# Patient Record
Sex: Male | Born: 1945 | Race: White | Hispanic: No | Marital: Married | Smoking: Never smoker
Health system: Southern US, Community
[De-identification: ages and names within clinical notes are randomized; demographics above are authoritative.]

## PROBLEM LIST (undated history)

## (undated) DIAGNOSIS — I10 Essential (primary) hypertension: Secondary | ICD-10-CM

## (undated) DIAGNOSIS — M545 Low back pain, unspecified: Secondary | ICD-10-CM

## (undated) DIAGNOSIS — C801 Malignant (primary) neoplasm, unspecified: Secondary | ICD-10-CM

## (undated) HISTORY — PX: PORT A CATH INJECTION (ARMC HX): HXRAD1731

## (undated) HISTORY — PX: OTHER SURGICAL HISTORY: SHX169

## (undated) HISTORY — PX: TONSILLECTOMY: SUR1361

---

## 2011-08-30 ENCOUNTER — Ambulatory Visit: Payer: Self-pay

## 2011-08-30 LAB — URINALYSIS, COMPLETE
Bilirubin,UR: NEGATIVE
Glucose,UR: NEGATIVE mg/dL (ref 0–75)
Ketone: NEGATIVE
Nitrite: POSITIVE
Ph: 6.5 (ref 4.5–8.0)
Protein: 300

## 2011-09-01 LAB — URINE CULTURE

## 2013-01-14 DIAGNOSIS — N400 Enlarged prostate without lower urinary tract symptoms: Secondary | ICD-10-CM | POA: Insufficient documentation

## 2013-08-15 ENCOUNTER — Emergency Department: Payer: Self-pay | Admitting: Emergency Medicine

## 2013-08-15 LAB — CBC WITH DIFFERENTIAL/PLATELET
BASOS ABS: 0.1 10*3/uL (ref 0.0–0.1)
BASOS PCT: 0.9 %
EOS PCT: 0.6 %
Eosinophil #: 0.1 10*3/uL (ref 0.0–0.7)
HCT: 46 % (ref 40.0–52.0)
HGB: 15.3 g/dL (ref 13.0–18.0)
LYMPHS PCT: 22.9 %
Lymphocyte #: 2.2 10*3/uL (ref 1.0–3.6)
MCH: 31.9 pg (ref 26.0–34.0)
MCHC: 33.2 g/dL (ref 32.0–36.0)
MCV: 96 fL (ref 80–100)
MONO ABS: 1.2 x10 3/mm — AB (ref 0.2–1.0)
Monocyte %: 12.4 %
NEUTROS PCT: 63.2 %
Neutrophil #: 6.2 10*3/uL (ref 1.4–6.5)
Platelet: 251 10*3/uL (ref 150–440)
RBC: 4.78 10*6/uL (ref 4.40–5.90)
RDW: 14.1 % (ref 11.5–14.5)
WBC: 9.8 10*3/uL (ref 3.8–10.6)

## 2013-08-15 LAB — COMPREHENSIVE METABOLIC PANEL
ALK PHOS: 64 U/L
ANION GAP: 4 — AB (ref 7–16)
Albumin: 3.5 g/dL (ref 3.4–5.0)
BILIRUBIN TOTAL: 0.3 mg/dL (ref 0.2–1.0)
BUN: 14 mg/dL (ref 7–18)
CREATININE: 1.47 mg/dL — AB (ref 0.60–1.30)
Calcium, Total: 9 mg/dL (ref 8.5–10.1)
Chloride: 106 mmol/L (ref 98–107)
Co2: 25 mmol/L (ref 21–32)
EGFR (African American): 56 — ABNORMAL LOW
EGFR (Non-African Amer.): 48 — ABNORMAL LOW
GLUCOSE: 124 mg/dL — AB (ref 65–99)
Osmolality: 272 (ref 275–301)
Potassium: 3.5 mmol/L (ref 3.5–5.1)
SGOT(AST): 38 U/L — ABNORMAL HIGH (ref 15–37)
SGPT (ALT): 46 U/L (ref 12–78)
SODIUM: 135 mmol/L — AB (ref 136–145)
Total Protein: 7.4 g/dL (ref 6.4–8.2)

## 2013-08-15 LAB — URINALYSIS, COMPLETE
Bilirubin,UR: NEGATIVE
Glucose,UR: NEGATIVE mg/dL (ref 0–75)
Ketone: NEGATIVE
Nitrite: NEGATIVE
Ph: 5 (ref 4.5–8.0)
Protein: 30
RBC,UR: 4 /HPF (ref 0–5)
SQUAMOUS EPITHELIAL: NONE SEEN
Specific Gravity: 1.023 (ref 1.003–1.030)

## 2013-08-15 LAB — CK TOTAL AND CKMB (NOT AT ARMC)
CK, TOTAL: 113 U/L (ref 35–232)
CK-MB: 2.1 ng/mL (ref 0.5–3.6)

## 2013-08-15 LAB — TROPONIN I: Troponin-I: <0.02 ng/mL

## 2014-11-11 NOTE — Consult Note (Signed)
PATIENT NAME:  Jon Gordon, Jon Gordon MR#:  [id number] DATE OF BIRTH:  08-Jan-1946  DATE OF CONSULTATION:  08/15/2013  CONSULTING PHYSICIAN:  Tana Conch. Leslye Peer, MD  PRIMARY CARE PHYSICIAN:  Dr. Astrid Divine  CONSULT REQUESTED BY:  Dr. Kerman Passey, Elizabeth: Near-syncope.   HISTORY OF PRESENT ILLNESS: This is a 69 year old man who has been sick, lying in bed for 2 days prior to this, with cough and congestion, not feeling well. He was over at a cafe today and he felt lightheaded and felt faint while he was sitting at the counter. When EMS arrived, he felt better. In the ER, he feels even better. Looking back, he was not eating and drinking very well for the past 2 days. Did not eat breakfast today, and was about to eat something for lunch. No complaints of chest pain. No shortness of breath. Some cough, grayish phlegm. In the ER, no  chest x-ray was done.   PAST MEDICAL HISTORY:  Waldenstrom's.   PAST SURGICAL HISTORY: Spleen removal, port placement, left testicle removal.   ALLERGIES: No known drug allergies.   MEDICATIONS: As per prescription writer include magnesium 400 mg daily, vitamin D 1000 international units daily.   SOCIAL HISTORY: Quit smoking in 1986. No alcohol. No drug use. Used to work as a Clinical cytogeneticist.   FAMILY HISTORY: Father died of heart disease, mother died of an MI.  REVIEW OF SYSTEMS:   CONSTITUTIONAL: Slight sweating. No fever. No chills. Positive for fatigue.  EYES: No blurry vision. Does wear contacts. EARS, NOSE, MOUTH, THROAT: Decreased hearing. Positive for sinus congestion. No difficulty swallowing. Positive for sore throat.  CARDIOVASCULAR: No chest pain. No palpitations.  RESPIRATORY: Positive for cough, gray phlegm.  GASTROINTESTINAL: No nausea. No vomiting. No abdominal pain. No diarrhea. Positive for constipation. History of hemorrhoid bleeding.  GENITOURINARY: No burning on urination or hematuria.  MUSCULOSKELETAL:  Positive for joint pain.  INTEGUMENT: No rashes or eruptions.  NEUROLOGIC: No fainting or blackouts.  PSYCHIATRIC: No anxiety or depression.  ENDOCRINE: No thyroid problems.  HEMATOLOGIC AND LYMPHATIC: No anemia, no easy bruising or bleeding.   PHYSICAL EXAMINATION: VITAL SIGNS: Temperature 98.3, pulse 69, respirations 18, blood pressure 120/60, pulse ox 96% on room air.  GENERAL: No respiratory distress.  EYES: Conjunctivae and lids normal. Pupils equal, round, and reactive to light. Extraocular muscles intact. No nystagmus.  EARS, NOSE, MOUTH AND THROAT: Tympanic membranes: Slight bulging, slight erythema. Throat: Slight erythema, no exudate seen. Lips and gums: No lesions.  NECK: No JVD. No bruits. No lymphadenopathy. No thyromegaly. No thyroid nodules palpated.  RESPIRATORY: Lungs clear to auscultation. No use of accessory muscles to breathe. No rhonchi, rales, or wheeze heard.  CARDIOVASCULAR SYSTEM: S1, S2 normal. No gallops, rubs, or murmurs heard. Carotid upstroke 2+ bilaterally. No bruits. Dorsalis pedis pulses 2+ bilaterally. Trace edema of the lower extremity.  ABDOMEN: Soft, nontender. No organomegaly/splenomegaly. Normoactive bowel sounds. No masses felt.  LYMPHATIC: No lymph nodes in the neck.  MUSCULOSKELETAL: No clubbing. Trace edema. No cyanosis.  SKIN: No rashes or ulcers seen.  NEUROLOGIC: Cranial nerves II through XII grossly intact. Deep tendon reflexes 2+ bilateral lower extremities.  PSYCHIATRIC: The patient is oriented to person, place, and time.   LABORATORY AND RADIOLOGICAL DATA: White blood cell count 9.8, H and H 15.3 and 46.0, platelet count of 251. Troponin negative. Glucose 124, BUN 14, creatinine 1.47, sodium 135, potassium 3.5, chloride 106, CO2 of 25, calcium 9.0. Liver function tests: AST slightly  elevated at 38. D-dimer negative. Urinalysis positive for leukocyte esterase.   ASSESSMENT AND PLAN:  1.  Near syncope. The patient has not been feeling well for  the past 2 days, not eating and drinking very much. The patient was hydrated in the ER and feels much better, and thinks he can go home. I will give the patient an antibiotic for an upper respiratory tract infection, also positive urinalysis. Unfortunately, no chest x-ray was done in the ER. Urine culture was not ordered, either. Will treat empirically with Augmentin for 10 days to treat upper respiratory tract infection. That should also treat urinary tract infection.   2.  Advise the patient to stay hydrated. The patient does have a history of Waldenstrom's. The patient will follow up with Dr. Astrid Divine as outpatient in follow up appointment.   Time spent on consultation: 50 minutes.    ____________________________ Tana Conch. Leslye Peer, MD rjw:mr D: 08/15/2013 18:44:00 ET T: 08/15/2013 20:01:15 ET JOB#: [id number]  cc: Floria Raveling. Astrid Divine, MD [place] Leslye Peer, MD, <Dictator>   Marisue Brooklyn MD ELECTRONICALLY SIGNED 08/19/2013 15:22

## 2014-11-23 ENCOUNTER — Other Ambulatory Visit: Payer: Self-pay | Admitting: Family Medicine

## 2014-11-23 DIAGNOSIS — G43709 Chronic migraine without aura, not intractable, without status migrainosus: Secondary | ICD-10-CM

## 2014-12-01 ENCOUNTER — Ambulatory Visit
Admission: RE | Admit: 2014-12-01 | Discharge: 2014-12-01 | Disposition: A | Discharge status: Home / Self Care | Payer: Medicare Other | Source: Ambulatory Visit | Attending: Family Medicine | Admitting: Family Medicine

## 2014-12-01 DIAGNOSIS — G43009 Migraine without aura, not intractable, without status migrainosus: Secondary | ICD-10-CM | POA: Diagnosis not present

## 2014-12-01 DIAGNOSIS — G43709 Chronic migraine without aura, not intractable, without status migrainosus: Secondary | ICD-10-CM

## 2014-12-01 MED ORDER — GADOBENATE DIMEGLUMINE 529 MG/ML IV SOLN
20.0000 mL | Freq: Once | INTRAVENOUS | Status: AC | PRN
  Administered 2014-12-01: 20 mL via INTRAVENOUS

## 2015-09-14 ENCOUNTER — Ambulatory Visit
Admission: EM | Admit: 2015-09-14 | Discharge: 2015-09-14 | Disposition: A | Discharge status: Home / Self Care | Payer: Medicare Other | Attending: Family Medicine | Admitting: Family Medicine

## 2015-09-14 DIAGNOSIS — S39012A Strain of muscle, fascia and tendon of lower back, initial encounter: Secondary | ICD-10-CM | POA: Diagnosis not present

## 2015-09-14 HISTORY — DX: Malignant (primary) neoplasm, unspecified: C80.1

## 2015-09-14 HISTORY — DX: Low back pain: M54.5

## 2015-09-14 HISTORY — DX: Low back pain, unspecified: M54.50

## 2015-09-14 MED ORDER — CYCLOBENZAPRINE HCL 10 MG PO TABS: 10.0000 mg | ORAL_TABLET | Freq: Every day | ORAL | Status: DC

## 2015-09-14 MED ORDER — KETOROLAC TROMETHAMINE 60 MG/2ML IM SOLN
60.0000 mg | Freq: Once | INTRAMUSCULAR | Status: AC
  Administered 2015-09-14: 60 mg via INTRAMUSCULAR

## 2015-09-14 MED ORDER — HYDROCODONE-ACETAMINOPHEN 5-325 MG PO TABS: 1.0000 | ORAL_TABLET | Freq: Four times a day (QID) | ORAL | Status: DC | PRN

## 2015-09-14 NOTE — ED Provider Notes (Signed)
CSN: [id number]     Arrival date & time 09/14/15  1814 History   None    Chief Complaint  Patient presents with  . Back Pain    Low Back Pain   (Consider location/radiation/quality/duration/timing/severity/associated sxs/prior Treatment) Patient is a 70 y.o. male presenting with back pain. The history is provided by the patient.  Back Pain Location:  Lumbar spine Quality:  Aching Radiates to:  Does not radiate Pain severity:  Severe Pain is:  Same all the time Duration:  6 hours Timing:  Constant Progression:  Unchanged Chronicity:  Recurrent Context: twisting   Context: not emotional stress, not falling, not jumping from heights, not lifting heavy objects, not MCA, not MVA, not occupational injury, not pedestrian accident, not physical stress, not recent illness and not recent injury   Relieved by:  None tried Worsened by:  Ambulation, twisting and standing Ineffective treatments:  None tried Associated symptoms: no abdominal pain, no abdominal swelling, no bladder incontinence, no bowel incontinence, no chest pain, no dysuria, no fever, no headaches, no leg pain, no numbness, no paresthesias, no pelvic pain, no perianal numbness, no tingling, no weakness and no weight loss     Past Medical History  Diagnosis Date  . Low back pain   . Cancer [facility])    Past Surgical History  Procedure Laterality Date  . Removal spleen    . Tonsillectomy    . Right knee arthorscopy    . Port a cath injection ([facility] hx)     History reviewed. No pertinent family history. Social History  Substance Use Topics  . Smoking status: Never Smoker   . Smokeless tobacco: Never Used  . Alcohol Use: No    Review of Systems  Constitutional: Negative for fever and weight loss.  Cardiovascular: Negative for chest pain.  Gastrointestinal: Negative for abdominal pain and bowel incontinence.  Genitourinary: Negative for bladder incontinence, dysuria and pelvic pain.  Musculoskeletal: Positive for back  pain.  Neurological: Negative for tingling, weakness, numbness, headaches and paresthesias.    Allergies  Nexium  Home Medications   Prior to Admission medications   Medication Sig Start Date End Date Taking? Authorizing Provider  cyclobenzaprine (FLEXERIL) 10 MG tablet Take 1 tablet (10 mg total) by mouth at bedtime. 09/14/15   Norval Gable, MD  HYDROcodone-acetaminophen (NORCO/VICODIN) 5-325 MG tablet Take 1-2 tablets by mouth every 6 (six) hours as needed. 09/14/15   Norval Gable, MD   Meds Ordered and Administered this Visit   Medications  ketorolac (TORADOL) injection 60 mg (60 mg Intramuscular Given 09/14/15 1953)    BP 145/68 mmHg  Pulse 60  Temp(Src) 98 F (36.7 C) (Oral)  Resp 16  Ht 5\' 10"  (1.778 m)  Wt 240 lb (108.863 kg)  BMI 34.44 kg/m2  SpO2 97% No data found.   Physical Exam  Constitutional: He appears well-developed and well-nourished. No distress.  Neck: Normal range of motion. Neck supple. No tracheal deviation present.  Pulmonary/Chest: Effort normal. No stridor. No respiratory distress.  Musculoskeletal:       Cervical back: Normal. He exhibits normal range of motion, no tenderness, no bony tenderness, no swelling, no edema, no deformity, no laceration, no pain, no spasm and normal pulse.       Lumbar back: He exhibits tenderness (over the lumbar paraspinous muscles bilaterally) and spasm. He exhibits normal range of motion, no bony tenderness, no swelling, no edema, no deformity, no laceration, no pain and normal pulse.  Neurological: He is alert. He has  normal reflexes. He exhibits normal muscle tone. Coordination normal.  Skin: No rash noted. He is not diaphoretic.  Nursing note and vitals reviewed.   ED Course  Procedures (including critical care time)  Labs Review Labs Reviewed - No data to display  Imaging Review No results found.   Visual Acuity Review  Right Eye Distance:   Left Eye Distance:   Bilateral Distance:    Right Eye  Near:   Left Eye Near:    Bilateral Near:         MDM   1. Lumbar strain, initial encounter    New Prescriptions   CYCLOBENZAPRINE (FLEXERIL) 10 MG TABLET    Take 1 tablet (10 mg total) by mouth at bedtime.   HYDROCODONE-ACETAMINOPHEN (NORCO/VICODIN) 5-325 MG TABLET    Take 1-2 tablets by mouth every 6 (six) hours as needed.   1. diagnosis reviewed with patient 2. rx as per orders above; reviewed possible side effects, interactions, risks and benefits  3. Recommend supportive treatment with heat to affected area; gentle stretching 4. Follow-up prn if symptoms worsen or don't improve    Norval Gable, MD 09/14/15 2005

## 2015-09-14 NOTE — ED Notes (Addendum)
Patient c/o low back pain which started earlier today after making a quick turn around the corner in his house.  Pain while walking and standing but not while in a seated position.  He has a history of low back pain however.  Lastly, the patient took 1 tab Norco 5-325mg  at 3:00pm or 4:00pm.

## 2015-12-18 ENCOUNTER — Ambulatory Visit
Admission: EM | Admit: 2015-12-18 | Discharge: 2015-12-18 | Disposition: A | Discharge status: Home / Self Care | Payer: Medicare PPO | Attending: Family Medicine | Admitting: Family Medicine

## 2015-12-18 ENCOUNTER — Ambulatory Visit
Admission: RE | Admit: 2015-12-18 | Discharge: 2015-12-18 | Disposition: A | Discharge status: Home / Self Care | Payer: Medicare PPO | Source: Ambulatory Visit | Attending: Family Medicine | Admitting: Family Medicine

## 2015-12-18 ENCOUNTER — Encounter: Payer: Self-pay | Admitting: *Deleted

## 2015-12-18 ENCOUNTER — Ambulatory Visit
Admission: EM | Admit: 2015-12-18 | Discharge: 2015-12-18 | Disposition: A | Discharge status: Home / Self Care | Payer: Medicare PPO | Source: Ambulatory Visit | Attending: Family Medicine | Admitting: Family Medicine

## 2015-12-18 DIAGNOSIS — M79604 Pain in right leg: Secondary | ICD-10-CM | POA: Diagnosis not present

## 2015-12-18 DIAGNOSIS — M7989 Other specified soft tissue disorders: Secondary | ICD-10-CM | POA: Insufficient documentation

## 2015-12-18 DIAGNOSIS — M79661 Pain in right lower leg: Secondary | ICD-10-CM

## 2015-12-18 NOTE — Discharge Instructions (Signed)
Musculoskeletal Pain Musculoskeletal pain is muscle and boney aches and pains. These pains can occur in any part of the body. Your caregiver may treat you without knowing the cause of the pain. They may treat you if blood or urine tests, X-rays, and other tests were normal.  CAUSES There is often not a definite cause or reason for these pains. These pains may be caused by a type of germ (virus). The discomfort may also come from overuse. Overuse includes working out too hard when your body is not fit. Boney aches also come from weather changes. Bone is sensitive to atmospheric pressure changes. HOME CARE INSTRUCTIONS   Ask when your test results will be ready. Make sure you get your test results.  Only take over-the-counter or prescription medicines for pain, discomfort, or fever as directed by your caregiver. If you were given medications for your condition, do not drive, operate machinery or power tools, or sign legal documents for 24 hours. Do not drink alcohol. Do not take sleeping pills or other medications that may interfere with treatment.  Continue all activities unless the activities cause more pain. When the pain lessens, slowly resume normal activities. Gradually increase the intensity and duration of the activities or exercise.  During periods of severe pain, bed rest may be helpful. Lay or sit in any position that is comfortable.  Putting ice on the injured area.  Put ice in a bag.  Place a towel between your skin and the bag.  Leave the ice on for 15 to 20 minutes, 3 to 4 times a day.  Follow up with your caregiver for continued problems and no reason can be found for the pain. If the pain becomes worse or does not go away, it may be necessary to repeat tests or do additional testing. Your caregiver may need to look further for a possible cause. SEEK IMMEDIATE MEDICAL CARE IF:  You have pain that is getting worse and is not relieved by medications.  You develop chest pain  that is associated with shortness or breath, sweating, feeling sick to your stomach (nauseous), or throw up (vomit).  Your pain becomes localized to the abdomen.  You develop any new symptoms that seem different or that concern you. MAKE SURE YOU:   Understand these instructions.  Will watch your condition.  Will get help right away if you are not doing well or get worse.   This information is not intended to replace advice given to you by your health care provider. Make sure you discuss any questions you have with your health care provider.   Document Released: 07/07/2005 Document Revised: 09/29/2011 Document Reviewed: 03/11/2013 Elsevier Interactive Patient Education 2016 Elsevier [employer]  Edema Edema is an abnormal buildup of fluids. It is more common in your legs and thighs. Painless swelling of the feet and ankles is more likely as a person ages. It also is common in looser skin, like around your eyes. HOME CARE   Keep the affected body part above the level of the heart while lying down.  Do not sit still or stand for a long time.  Do not put anything right under your knees when you lie down.  Do not wear tight clothes on your upper legs.  Exercise your legs to help the puffiness (swelling) go down.  Wear elastic bandages or support stockings as told by your doctor.  A low-salt diet may help lessen the puffiness.  Only take medicine as told by your doctor. GET HELP  IF:  Treatment is not working.  You have heart, liver, or kidney disease and notice that your skin looks puffy or shiny.  You have puffiness in your legs that does not get better when you raise your legs.  You have sudden weight gain for no reason. GET HELP RIGHT AWAY IF:   You have shortness of breath or chest pain.  You cannot breathe when you lie down.  You have pain, redness, or warmth in the areas that are puffy.  You have heart, liver, or kidney disease and get edema all of a sudden.  You  have a fever and your symptoms get worse all of a sudden. MAKE SURE YOU:   Understand these instructions.  Will watch your condition.  Will get help right away if you are not doing well or get worse.   This information is not intended to replace advice given to you by your health care provider. Make sure you discuss any questions you have with your health care provider.   Document Released: 12/24/2007 Document Revised: 07/12/2013 Document Reviewed: 04/29/2013 Elsevier Interactive Patient Education [employer].

## 2015-12-18 NOTE — ED Notes (Signed)
Onset of right calf pain, onset 3-4 hours ago. Pain increases with full leg extension and is tender to palpation on the medial aspect of right calf.

## 2015-12-18 NOTE — ED Provider Notes (Signed)
CSN: [id number]     Arrival date & time 12/18/15  1645 History   First MD Initiated Contact with Patient 12/18/15 1817    Nurses notes were reviewed. Chief Complaint  Patient presents with  . Leg Pain  Patient is a here because of pain in the right leg. He reports pain in the right calf started earlier today. He has also noticed some swelling in the right lower leg as well. He denies any history of trauma injury to the right leg but states that over the weekend he mowed his lawn several times and was on the tractor for several hours both Saturday and Sunday. (Consider location/radiation/quality/duration/timing/severity/associated sxs/prior Treatment) Patient is a 70 y.o. male presenting with leg pain. The history is provided by the patient. No language interpreter was used.  Leg Pain Location:  Leg Injury: no   Leg location:  R lower leg Pain details:    Quality:  Aching, pressure and throbbing   Radiates to:  Does not radiate   Severity:  Moderate   Onset quality:  Sudden   Timing:  Constant Chronicity:  New Foreign body present:  No foreign bodies Prior injury to area:  No Relieved by:  Nothing Worsened by:  Nothing tried Ineffective treatments:  None tried Associated symptoms: stiffness and swelling   Risk factors: no concern for non-accidental trauma, no frequent fractures, no known bone disorder and no recent illness     Past Medical History  Diagnosis Date  . Low back pain   . Cancer [facility])    Past Surgical History  Procedure Laterality Date  . Removal spleen    . Tonsillectomy    . Right knee arthorscopy    . Port a cath injection ([facility] hx)     History reviewed. No pertinent family history. Social History  Substance Use Topics  . Smoking status: Never Smoker   . Smokeless tobacco: Never Used  . Alcohol Use: No    Review of Systems  Musculoskeletal: Positive for myalgias and stiffness.  All other systems reviewed and are negative.   Allergies   Nexium  Home Medications   Prior to Admission medications   Medication Sig Start Date End Date Taking? Authorizing Provider  cyclobenzaprine (FLEXERIL) 10 MG tablet Take 1 tablet (10 mg total) by mouth at bedtime. 09/14/15   Norval Gable, MD  HYDROcodone-acetaminophen (NORCO/VICODIN) 5-325 MG tablet Take 1-2 tablets by mouth every 6 (six) hours as needed. 09/14/15   Norval Gable, MD   Meds Ordered and Administered this Visit  Medications - No data to display  BP 145/73 mmHg  Pulse 57  Temp(Src) 98.2 F (36.8 C) (Oral)  Resp 16  Ht 5\' 10"  (1.778 m)  Wt 240 lb (108.863 kg)  BMI 34.44 kg/m2  SpO2 98% No data found.   Physical Exam  Constitutional: He is oriented to person, place, and time. He appears well-developed and well-nourished.  HENT:  Head: Normocephalic and atraumatic.  Eyes: Pupils are equal, round, and reactive to light.  Neck: Normal range of motion.  Musculoskeletal: He exhibits edema and tenderness.  Neurological: He is alert and oriented to person, place, and time.  Skin: Skin is warm. No erythema.  Psychiatric: He has a normal mood and affect.  Vitals reviewed.   ED Course  Procedures (including critical care time)  Labs Review Labs Reviewed - No data to display  Imaging Review No results found.   Visual Acuity Review  Right Eye Distance:   Left Eye Distance:  Bilateral Distance:    Right Eye Near:   Left Eye Near:    Bilateral Near:        MDM   1. Right leg pain   2. Pain and swelling of right lower leg    Patient had several questions are as if this could be a possible blood clot he 1 m diagnosing here in the office and explained to him there is no way that I can diagnose him for blood clot without doing a study. He was worried about co-pays for symptom hospital at this point in time is no other option I did explain to it is superficial or is no blood clots he gets to go home if this of deep blood clot he will need to go to the  emergency room the hope process starts all over and his new charges.Marland Kitchen He wanted to know if I can give him something to dissolve clots right ear and does not do the study explained to him actually were not dissolving clots were just trying to prevent further clots from occurring and that if he has any shortness of breath which is what he was concerned about the knee may have infiltrated but they don't do that initially.  He'll go to [facility] now to have the ultrasound done since it is after 6:30 PM.after 6:30 PM.   Note: This dictation was prepared with Dragon dictation along with smaller phrase technology. Any transcriptional errors that result from this process are unintentional.   Ultrasound report verbal was negative patient was informed and informed not to do anything different but just go home and rest the leg pop his PCP if needed.Frederich Cha, MD 12/18/15 2039

## 2017-07-19 ENCOUNTER — Other Ambulatory Visit: Payer: Self-pay

## 2017-07-19 ENCOUNTER — Encounter: Payer: Self-pay | Admitting: *Deleted

## 2017-07-19 ENCOUNTER — Ambulatory Visit
Admission: EM | Admit: 2017-07-19 | Discharge: 2017-07-19 | Disposition: A | Discharge status: Home / Self Care | Payer: Medicare HMO | Attending: Family Medicine | Admitting: Family Medicine

## 2017-07-19 DIAGNOSIS — M545 Low back pain, unspecified: Secondary | ICD-10-CM

## 2017-07-19 MED ORDER — KETOROLAC TROMETHAMINE 60 MG/2ML IM SOLN
60.0000 mg | Freq: Once | INTRAMUSCULAR | Status: AC
  Administered 2017-07-19: 60 mg via INTRAMUSCULAR

## 2017-07-19 MED ORDER — CYCLOBENZAPRINE HCL 10 MG PO TABS: 10.0000 mg | ORAL_TABLET | Freq: Every day | ORAL | 0 refills | Status: DC

## 2017-07-19 MED ORDER — HYDROCODONE-ACETAMINOPHEN 5-325 MG PO TABS: ORAL_TABLET | ORAL | 0 refills | Status: DC

## 2017-07-19 NOTE — ED Triage Notes (Signed)
Patient started having lower back pain 1.5 weeks ago and has been treated by a chiropractor. Back pain has not resolved.

## 2017-07-19 NOTE — ED Provider Notes (Signed)
[facility]    CSN: [id number] Arrival date & time: 07/19/17  1440     History   Chief Complaint Chief Complaint  Patient presents with  . Back Pain    HPI Jon Gordon is a 71 y.o. male.   71 yo male with a h/o chronic, intermittent back pain presents with a c/o flare up of his back condition for the past 1.5 weeks. Has seen his chiropractor which has helped some, however patient still having pain. Denies numbness/tingling, pain radiating, bowel or bladder problems. Patient was seen here last year for same and states toradol and muscle relaxer helped.    The history is provided by the patient.  Back Pain    Past Medical History:  Diagnosis Date  . Cancer ([place])   . Low back pain     There are no active problems to display for this patient.   Past Surgical History:  Procedure Laterality Date  . PORT A CATH INJECTION ([facility])    . Removal Spleen    . Right Knee Arthorscopy    . TONSILLECTOMY         Home Medications    Prior to Admission medications   Medication Sig Start Date End Date Taking? Authorizing Provider  cyclobenzaprine (FLEXERIL) 10 MG tablet Take 1 tablet (10 mg total) by mouth at bedtime. 07/19/17   Norval Gable, MD  HYDROcodone-acetaminophen (NORCO/VICODIN) 5-325 MG tablet 1-2 tabs po bid prn 07/19/17   Norval Gable, MD    Family History History reviewed. No pertinent family history.  Social History Social History   Tobacco Use  . Smoking status: Never Smoker  . Smokeless tobacco: Never Used  Substance Use Topics  . Alcohol use: No  . Drug use: No     Allergies   Nexium [esomeprazole]   Review of Systems Review of Systems  Musculoskeletal: Positive for back pain.     Physical Exam Triage Vital Signs ED Triage Vitals  Enc Vitals Group     BP 07/19/17 1518 (!) 137/58     Pulse Rate 07/19/17 1518 69     Resp 07/19/17 1518 16     Temp 07/19/17 1518 98.7 F (37.1 C)     Temp Source 07/19/17 1518 Oral      SpO2 07/19/17 1518 99 %     Weight 07/19/17 1519 243 lb (110.2 kg)     Height 07/19/17 1519 5\' 10"  (1.778 m)     Head Circumference --      Peak Flow --      Pain Score 07/19/17 1519 0     Pain Loc --      Pain Edu? --      Excl. in [place]? --    No data found.  Updated Vital Signs BP (!) 137/58 (BP Location: Left Arm)   Pulse 69   Temp 98.7 F (37.1 C) (Oral)   Resp 16   Ht 5\' 10"  (1.778 m)   Wt 243 lb (110.2 kg)   SpO2 99%   BMI 34.87 kg/m   Visual Acuity Right Eye Distance:   Left Eye Distance:   Bilateral Distance:    Right Eye Near:   Left Eye Near:    Bilateral Near:     Physical Exam  Constitutional: He appears well-developed and well-nourished. No distress.  Neck: Normal range of motion. Neck supple. No tracheal deviation present.  Pulmonary/Chest: Effort normal. No stridor. No respiratory distress.  Musculoskeletal:  Cervical back: He exhibits normal range of motion, no tenderness, no bony tenderness, no swelling, no edema, no deformity, no laceration, no pain, no spasm and normal pulse.       Lumbar back: He exhibits tenderness and spasm. He exhibits normal range of motion, no bony tenderness, no swelling, no edema, no deformity, no laceration, no pain and normal pulse.  Neurological: He is alert. He has normal reflexes. He displays normal reflexes. He exhibits normal muscle tone. Coordination normal.  Skin: No rash noted. He is not diaphoretic.  Nursing note and vitals reviewed.    UC Treatments / Results  Labs (all labs ordered are listed, but only abnormal results are displayed) Labs Reviewed - No data to display  EKG  EKG Interpretation None       Radiology No results found.  Procedures Procedures (including critical care time)  Medications Ordered in UC Medications  ketorolac (TORADOL) injection 60 mg (60 mg Intramuscular Given 07/19/17 1554)     Initial Impression / Assessment and Plan / UC Course  I have reviewed the triage  vital signs and the nursing notes.  Pertinent labs & imaging results that were available during my care of the patient were reviewed by me and considered in my medical decision making (see chart for details).       Final Clinical Impressions(s) / UC Diagnoses   Final diagnoses:  Acute bilateral low back pain without sciatica    ED Discharge Orders        Ordered    cyclobenzaprine (FLEXERIL) 10 MG tablet  Daily at bedtime     07/19/17 1612    HYDROcodone-acetaminophen (NORCO/VICODIN) 5-325 MG tablet     07/19/17 1612     1. diagnosis reviewed with patient 2. Given Toradol 60mg  IM x1  3. rx as per orders above; reviewed possible side effects, interactions, risks and benefits  4. Recommend supportive treatment with stretches, heat/ice, otc analgesics prn  5. Follow-up prn if symptoms worsen or don't improve  Controlled Substance Prescriptions  Controlled Substance Registry consulted? Not Applicable   Norval Gable, MD 07/19/17 [phone]

## 2017-08-23 ENCOUNTER — Ambulatory Visit
Admission: EM | Admit: 2017-08-23 | Discharge: 2017-08-23 | Disposition: A | Discharge status: Home / Self Care | Payer: Medicare HMO | Attending: Emergency Medicine | Admitting: Emergency Medicine

## 2017-08-23 ENCOUNTER — Encounter: Payer: Self-pay | Admitting: Emergency Medicine

## 2017-08-23 ENCOUNTER — Other Ambulatory Visit: Payer: Self-pay

## 2017-08-23 DIAGNOSIS — L03031 Cellulitis of right toe: Secondary | ICD-10-CM

## 2017-08-23 DIAGNOSIS — L02611 Cutaneous abscess of right foot: Secondary | ICD-10-CM | POA: Diagnosis not present

## 2017-08-23 DIAGNOSIS — L0291 Cutaneous abscess, unspecified: Secondary | ICD-10-CM

## 2017-08-23 MED ORDER — CEPHALEXIN 500 MG PO CAPS: 500.0000 mg | ORAL_CAPSULE | Freq: Four times a day (QID) | ORAL | 0 refills | Status: DC

## 2017-08-23 NOTE — ED Triage Notes (Signed)
Patient c/o bump on his left 3rd toe on his left foot for months.

## 2017-08-23 NOTE — ED Provider Notes (Signed)
[facility]    CSN: [id number] Arrival date & time: 08/23/17  0905     History   Chief Complaint Chief Complaint  Patient presents with  . Abscess    HPI Jon Gordon is a 72 y.o. male.   Pt states that he has noticed a bump to his RT 2nd toe for over approx 1 yr now.last night his wife "popped" it and it began red and hard. Denies any pain. Denies any rubbing of the shoe to area. Has not taken anything for this.       Past Medical History:  Diagnosis Date  . Cancer ([place])   . Low back pain     There are no active problems to display for this patient.   Past Surgical History:  Procedure Laterality Date  . PORT A CATH INJECTION ([place] [facility])    . Removal Spleen    . Right Knee Arthorscopy    . TONSILLECTOMY         Home Medications    Prior to Admission medications   Medication Sig Start Date End Date Taking? Authorizing Provider  cyclobenzaprine (FLEXERIL) 10 MG tablet Take 1 tablet (10 mg total) by mouth at bedtime. 07/19/17   Norval Gable, MD  HYDROcodone-acetaminophen (NORCO/VICODIN) 5-325 MG tablet 1-2 tabs po bid prn 07/19/17   Norval Gable, MD    Family History History reviewed. No pertinent family history.  Social History Social History   Tobacco Use  . Smoking status: Never Smoker  . Smokeless tobacco: Never Used  Substance Use Topics  . Alcohol use: No  . Drug use: No     Allergies   Nexium [esomeprazole]   Review of Systems Review of Systems  Constitutional: Negative.   Respiratory: Negative.   Cardiovascular: Negative.   Skin: Positive for wound.       RT 2nd toe has a small pin point area to mid toes that has redness and a hard flat circle with a scabbing.   Neurological: Negative.      Physical Exam Triage Vital Signs ED Triage Vitals  Enc Vitals Group     BP 08/23/17 0925 (!) 142/60     Pulse Rate 08/23/17 0925 68     Resp 08/23/17 0925 16     Temp 08/23/17 0925 98 F (36.7 C)     Temp Source  08/23/17 0925 Oral     SpO2 08/23/17 0925 97 %     Weight 08/23/17 0922 243 lb (110.2 kg)     Height 08/23/17 0922 5\' 9"  (1.753 m)     Head Circumference --      Peak Flow --      Pain Score 08/23/17 0922 0     Pain Loc --      Pain Edu? --      Excl. in [place]? --    No data found.  Updated Vital Signs BP (!) 142/60 (BP Location: Right Arm)   Pulse 68   Temp 98 F (36.7 C) (Oral)   Resp 16   Ht 5\' 9"  (1.753 m)   Wt 243 lb (110.2 kg)   SpO2 97%   BMI 35.88 kg/m   Visual Acuity     Physical Exam  Constitutional: He appears well-developed.  Cardiovascular: Normal rate.  Pulmonary/Chest: Effort normal and breath sounds normal.  Musculoskeletal:  RT 2nd toe has a  Small hard non fluid filled area mid center of toe with a 1cm boarder of erythema to area. Non  tender.   Skin: Capillary refill takes less than 2 seconds. There is erythema.     UC Treatments / Results  Labs (all labs ordered are listed, but only abnormal results are displayed) Labs Reviewed - No data to display  EKG  EKG Interpretation None       Radiology No results found.  Procedures Procedures (including critical care time)  Medications Ordered in UC Medications - No data to display   Initial Impression / Assessment and Plan / UC Course  I have reviewed the triage vital signs and the nursing notes.  Pertinent labs & imaging results that were available during my care of the patient were reviewed by me and considered in my medical decision making (see chart for details).     Wash dry area well Apply a bandage to area to prevent rubbing  Take full dose of abx  See podiatry you may need a referral from pcp    Final Clinical Impressions(s) / UC Diagnoses   Final diagnoses:  None    ED Discharge Orders    None       Controlled Substance Prescriptions [place] Controlled Substance Registry consulted? Not Applicable   Marney Setting, NP 08/23/17 [phone]

## 2017-08-23 NOTE — Discharge Instructions (Signed)
Wash dry area well Apply a bandage to area to prevent rubbing  Take full dose of abx  See podiatry you may need a referral from pcp

## 2017-08-26 ENCOUNTER — Telehealth: Payer: Self-pay

## 2017-08-26 NOTE — Telephone Encounter (Signed)
Called pt for f/u and he states his wound is still sore and now has crusty outside. Has f/u appt with PCP on Monday. Instr if he needs to be seen prior to Monday, he can return here. Verbalized understanding.

## 2020-08-08 ENCOUNTER — Ambulatory Visit
Admission: EM | Admit: 2020-08-08 | Discharge: 2020-08-08 | Disposition: A | Discharge status: Home / Self Care | Payer: Medicare Other | Attending: Emergency Medicine | Admitting: Emergency Medicine

## 2020-08-08 ENCOUNTER — Encounter: Payer: Self-pay | Admitting: Emergency Medicine

## 2020-08-08 ENCOUNTER — Other Ambulatory Visit: Payer: Self-pay

## 2020-08-08 DIAGNOSIS — M545 Low back pain, unspecified: Secondary | ICD-10-CM | POA: Diagnosis not present

## 2020-08-08 HISTORY — DX: Essential (primary) hypertension: I10

## 2020-08-08 MED ORDER — KETOROLAC TROMETHAMINE 60 MG/2ML IM SOLN
60.0000 mg | Freq: Once | INTRAMUSCULAR | Status: AC
  Administered 2020-08-08: 60 mg via INTRAMUSCULAR

## 2020-08-08 MED ORDER — HYDROCODONE-ACETAMINOPHEN 5-325 MG PO TABS: 2.0000 | ORAL_TABLET | ORAL | 0 refills | Status: DC | PRN

## 2020-08-08 NOTE — ED Provider Notes (Signed)
[facility]    CSN: [id number] Arrival date & time: 08/08/20  1800      History   Chief Complaint No chief complaint on file.   HPI Jon Gordon is a 75 y.o. male.   HPI   75 year old male here for evaluation of low back pain.  Patient reports that he has a long history of low back problems.  Patient did have a fall last week where he landed on both hands and his right knee but states that he was having pain for the fall.  Patient reports his pain is worse on the right than the left.  Patient states that trying to stand up straight increases the pain and leaning over at the waist improves the pain.  Patient denies radiation into either leg, numbness, tingling, or weakness in either extremity, and denies heavy lifting.  Past Medical History:  Diagnosis Date  . Cancer ([place])   . Hypertension   . Low back pain     There are no problems to display for this patient.   Past Surgical History:  Procedure Laterality Date  . PORT A CATH INJECTION ([place] [facility]X)    . Removal Spleen    . Right Knee Arthorscopy    . TONSILLECTOMY         Home Medications    Prior to Admission medications   Medication Sig Start Date End Date Taking? Authorizing Provider  HYDROcodone-acetaminophen (NORCO/VICODIN) 5-325 MG tablet Take 2 tablets by mouth every 4 (four) hours as needed. 08/08/20  Yes Margarette Canada, NP    Family History History reviewed. No pertinent family history.  Social History Social History   Tobacco Use  . Smoking status: Never Smoker  . Smokeless tobacco: Never Used  Vaping Use  . Vaping Use: Never used  Substance Use Topics  . Alcohol use: No  . Drug use: No     Allergies   Nexium [esomeprazole]   Review of Systems Review of Systems  Constitutional: Negative for activity change.  Musculoskeletal: Positive for back pain. Negative for arthralgias and myalgias.  Skin: Negative for rash.  Neurological: Negative for numbness.  Hematological:  Negative.   Psychiatric/Behavioral: Negative.      Physical Exam Triage Vital Signs ED Triage Vitals  Enc Vitals Group     BP 08/08/20 1907 (!) 138/56     Pulse Rate 08/08/20 1907 62     Resp 08/08/20 1907 16     Temp 08/08/20 1907 98.3 F (36.8 C)     Temp Source 08/08/20 1907 Oral     SpO2 08/08/20 1907 96 %     Weight 08/08/20 1903 210 lb (95.3 kg)     Height 08/08/20 1903 5\' 10"  (1.778 m)     Head Circumference --      Peak Flow --      Pain Score 08/08/20 1902 0     Pain Loc --      Pain Edu? --      Excl. in [place]? --    No data found.  Updated Vital Signs BP (!) 138/56 (BP Location: Left Arm)   Pulse 62   Temp 98.3 F (36.8 C) (Oral)   Resp 16   Ht 5\' 10"  (1.778 m)   Wt 210 lb (95.3 kg)   SpO2 96%   BMI 30.13 kg/m   Visual Acuity Right Eye Distance:   Left Eye Distance:   Bilateral Distance:    Right Eye Near:   Left Eye  Near:    Bilateral Near:     Physical Exam Vitals and nursing note reviewed.  Constitutional:      General: He is not in acute distress.    Appearance: Normal appearance.  HENT:     Head: Normocephalic and atraumatic.  Cardiovascular:     Rate and Rhythm: Normal rate and regular rhythm.     Pulses: Normal pulses.     Heart sounds: Normal heart sounds. No murmur heard. No gallop.   Pulmonary:     Effort: Pulmonary effort is normal.     Breath sounds: Normal breath sounds. No wheezing, rhonchi or rales.  Musculoskeletal:        General: Tenderness present. No swelling or deformity. Normal range of motion.  Skin:    General: Skin is warm and dry.     Capillary Refill: Capillary refill takes less than 2 seconds.     Findings: No erythema.  Neurological:     General: No focal deficit present.     Mental Status: He is alert and oriented to person, place, and time.     Motor: No weakness.     Deep Tendon Reflexes: Reflexes normal.  Psychiatric:        Mood and Affect: Mood normal.        Behavior: Behavior normal.         Thought Content: Thought content normal.        Judgment: Judgment normal.      UC Treatments / Results  Labs (all labs ordered are listed, but only abnormal results are displayed) Labs Reviewed - No data to display  EKG   Radiology No results found.  Procedures Procedures (including critical care time)  Medications Ordered in UC Medications  ketorolac (TORADOL) injection 60 mg (has no administration in time range)    Initial Impression / Assessment and Plan / UC Course  I have reviewed the triage vital signs and the nursing notes.  Pertinent labs & imaging results that were available during my care of the patient were reviewed by me and considered in my medical decision making (see chart for details).   Is here for evaluation of low back pain that has been going on for a while.  Patient reports that this is a longstanding occurrence.  Patient has been evaluated for low back pain 3 times at this urgent care since 2017.  Each time he has been treated with an injection of Toradol, and discharged home with Vicodin for pain control.   Final Clinical Impressions(s) / UC Diagnoses   Final diagnoses:  Bilateral low back pain without sciatica, unspecified chronicity     Discharge Instructions     The injection we gave will help you with the inflammation.  Follow the low back exercises given in your discharge paperwork.  Use the Norco as needed for severe pain.  Do not take with other Tylenol containing products.  Do not take with alcohol, and do not drive a vehicle.    ED Prescriptions    Medication Sig Dispense Auth. Provider   HYDROcodone-acetaminophen (NORCO/VICODIN) 5-325 MG tablet Take 2 tablets by mouth every 4 (four) hours as needed. 10 tablet Margarette Canada, NP     I have reviewed the PDMP during this encounter.   Margarette Canada, NP 08/08/20 2012

## 2020-08-08 NOTE — Discharge Instructions (Signed)
The injection we gave will help you with the inflammation.  Follow the low back exercises given in your discharge paperwork.  Use the Norco as needed for severe pain.  Do not take with other Tylenol containing products.  Do not take with alcohol, and do not drive a vehicle.

## 2020-08-08 NOTE — ED Triage Notes (Signed)
Pt presents with lower back pain.  He fell down outside steps last week.

## 2021-02-16 ENCOUNTER — Other Ambulatory Visit: Payer: Self-pay

## 2021-02-16 ENCOUNTER — Ambulatory Visit
Admission: EM | Admit: 2021-02-16 | Discharge: 2021-02-16 | Disposition: A | Discharge status: Home / Self Care | Payer: Medicare Other | Attending: Family Medicine | Admitting: Family Medicine

## 2021-02-16 DIAGNOSIS — M545 Low back pain, unspecified: Secondary | ICD-10-CM | POA: Diagnosis not present

## 2021-02-16 MED ORDER — KETOROLAC TROMETHAMINE 60 MG/2ML IM SOLN
30.0000 mg | Freq: Once | INTRAMUSCULAR | Status: AC
  Administered 2021-02-16: 30 mg via INTRAMUSCULAR

## 2021-02-16 NOTE — ED Triage Notes (Signed)
Patient states that he is here for low back pain that started around 2-3 days ago.

## 2021-02-16 NOTE — Discharge Instructions (Addendum)
Rest.  Heat.  Gentle stretching.  Take care  Dr. Lacinda Axon

## 2021-02-17 NOTE — ED Provider Notes (Signed)
[facility]    CSN: [id number] Arrival date & time: 02/16/21  1357      History   Chief Complaint Chief Complaint  Patient presents with   Back Pain    HPI  75 year old male presents with back pain.  Low back x 2-3 days. No known inciting factor. No radicular symptoms. He reports decreased ROM. Pain 6/10 in severity. Achy in character. No saddle anesthesia or incontinence. No other complaints.    Past Medical History:  Diagnosis Date   Cancer ([place])    Hypertension    Low back pain     Past Surgical History:  Procedure Laterality Date   PORT A CATH INJECTION ([facility] HX)     Removal Spleen     Right Knee Arthorscopy     TONSILLECTOMY         Home Medications    Prior to Admission medications   Medication Sig Start Date End Date Taking? Authorizing Provider  lisinopril (ZESTRIL) 2.5 MG tablet Take 2.5 mg by mouth daily. 12/21/20   [provider]    Family History History reviewed. No pertinent family history.  Social History Social History   Tobacco Use   Smoking status: Never   Smokeless tobacco: Never  Vaping Use   Vaping Use: Never used  Substance Use Topics   Alcohol use: No   Drug use: No     Allergies   Nexium [esomeprazole]   Review of Systems Review of Systems Per HPI  Physical Exam Triage Vital Signs ED Triage Vitals  Enc Vitals Group     BP 02/16/21 1407 (!) 129/57     Pulse Rate 02/16/21 1407 62     Resp 02/16/21 1407 18     Temp 02/16/21 1407 98.8 F (37.1 C)     Temp Source 02/16/21 1407 Oral     SpO2 02/16/21 1407 96 %     Weight 02/16/21 1405 218 lb (98.9 kg)     Height 02/16/21 1405 '5\' 10"'$  (1.778 m)     Head Circumference --      Peak Flow --      Pain Score 02/16/21 1405 6     Pain Loc --      Pain Edu? --      Excl. in [place]? --    No data found.  Updated Vital Signs BP (!) 129/57 (BP Location: Right Arm)   Pulse 62   Temp 98.8 F (37.1 C) (Oral)   Resp 18   Ht '5\' 10"'$  (1.778 m)   Wt  98.9 kg   SpO2 96%   BMI 31.28 kg/m   Visual Acuity Right Eye Distance:   Left Eye Distance:   Bilateral Distance:    Right Eye Near:   Left Eye Near:    Bilateral Near:     Physical Exam Vitals and nursing note reviewed.  Constitutional:      General: He is not in acute distress.    Appearance: Normal appearance. He is not ill-appearing.  HENT:     Head: Normocephalic and atraumatic.  Cardiovascular:     Rate and Rhythm: Normal rate and regular rhythm.  Pulmonary:     Effort: Pulmonary effort is normal.     Breath sounds: Normal breath sounds.  Musculoskeletal:     Comments: Mild tenderness of the paraspinal musculature of the lumbar spine.  Neurological:     Mental Status: He is alert.  Psychiatric:  Mood and Affect: Mood normal.        Behavior: Behavior normal.     UC Treatments / Results  Labs (all labs ordered are listed, but only abnormal results are displayed) Labs Reviewed - No data to display  EKG   Radiology No results found.  Procedures Procedures (including critical care time)  Medications Ordered in UC Medications  ketorolac (TORADOL) injection 30 mg (30 mg Intramuscular Given 02/16/21 1430)    Initial Impression / Assessment and Plan / UC Course  I have reviewed the triage vital signs and the nursing notes.  Pertinent labs & imaging results that were available during my care of the patient were reviewed by me and considered in my medical decision making (see chart for details).    75 year old male presents with acute back pain. Treating with IM Toradol (per patient request). Renal function - most recent creatinine 1.1 with GFR 30. IM Toradol 30 mg given. Advised heat and supportive care.   Final Clinical Impressions(s) / UC Diagnoses   Final diagnoses:  Acute bilateral low back pain without sciatica     Discharge Instructions      Rest.  Heat.  Gentle stretching.  Take care  Dr. Lacinda Axon    ED Prescriptions   None     PDMP not reviewed this encounter.   Coral Spikes, [place] 02/17/21 2247

## 2021-02-22 ENCOUNTER — Other Ambulatory Visit: Payer: Self-pay

## 2021-02-22 ENCOUNTER — Ambulatory Visit
Admission: RE | Admit: 2021-02-22 | Discharge: 2021-02-22 | Disposition: A | Discharge status: Home / Self Care | Payer: Medicare Other | Source: Ambulatory Visit | Attending: Physician Assistant | Admitting: Physician Assistant

## 2021-02-22 VITALS — BP 137/60 | HR 62 | Temp 98.7°F | Resp 16 | Ht 70.0 in | Wt 218.0 lb

## 2021-02-22 DIAGNOSIS — M545 Low back pain, unspecified: Secondary | ICD-10-CM

## 2021-02-22 MED ORDER — KETOROLAC TROMETHAMINE 60 MG/2ML IM SOLN
30.0000 mg | Freq: Once | INTRAMUSCULAR | Status: AC
Start: 2021-02-22 — End: 2021-02-22
  Administered 2021-02-22: 30 mg via INTRAMUSCULAR

## 2021-02-22 MED ORDER — BACLOFEN 10 MG PO TABS: 10.0000 mg | ORAL_TABLET | Freq: Three times a day (TID) | ORAL | 0 refills | Status: DC | PRN

## 2021-02-22 NOTE — ED Triage Notes (Signed)
Patient c/o ongoing lower back pain that started a week ago.  Patient states that he might would like a muscle relaxer Patient denies recent injury or fall.

## 2021-02-22 NOTE — Discharge Instructions (Addendum)
BACK PAIN: Stressed avoiding painful activities . RICE (REST, ICE, COMPRESSION, ELEVATION) guidelines reviewed. May alternate ice and heat. Consider use of muscle rubs, Salonpas patches, lidocaine patches, etc. Use medications as directed including muscle relaxers if prescribed. Take anti-inflammatory medications as prescribed or OTC NSAIDs/Tylenol.  F/u with PCP in 7-10 days for reexamination, and please feel free to call or return to the urgent care at any time for any questions or concerns you may have and we will be happy to help you!  As we discussed if back pain goes beyond 4 to 6 weeks he may need to follow-up with orthopedics.  If you do not get better in the next week or so then I would go to [facility] walk-in urgent care in [place] or follow-up with your primary care provider.  You may need an MRI.  BACK PAIN RED FLAGS: If the back pain acutely worsens or there are any red flag symptoms such as numbness/tingling, leg weakness, saddle anesthesia, or loss of bowel/bladder control, go immediately to the ER. Follow up with [place] as scheduled or sooner if the pain does not begin to resolve or if it worsens before the follow up

## 2021-02-22 NOTE — ED Provider Notes (Signed)
[facility]    CSN: [id number] Arrival date & time: 02/22/21  1433      History   Chief Complaint Chief Complaint  Patient presents with   Back Pain    HPI Jon Gordon is a 75 y.o. male presenting for ~1 week history of lower back pain. Denies injury.  Patient says he has back pain occasionally for several years.  He has currently been taking Tylenol and using heating pad as well as seeing his chiropractor.  Patient says that his pain has improved a little. Patient was seen about 1 week ago for LBP and given ketorolac injection in clinic.  Pain was reportedly 6 out of 10 at previous visit and is now currently 4 out of 10.  Pain does not radiate to extremities and he denies any numbness, weakness or tingling.  No saddle anesthesia or loss of bowel or bladder control.  Patient most increased pain with sitting for periods of time and if he gets up too quickly as well as if he twists his body sharply.  Patient states that his chiropractor advised something to help with his muscle tightness.  He has no other complaints or concerns.  HPI  Past Medical History:  Diagnosis Date   Cancer ([place])    Hypertension    Low back pain     There are no problems to display for this patient.   Past Surgical History:  Procedure Laterality Date   PORT A CATH INJECTION ([facility] HX)     Removal Spleen     Right Knee Arthorscopy     TONSILLECTOMY         Home Medications    Prior to Admission medications   Medication Sig Start Date End Date Taking? Authorizing Provider  baclofen (LIORESAL) 10 MG tablet Take 1 tablet (10 mg total) by mouth 3 (three) times daily as needed for muscle spasms. 02/22/21  Yes Laurene Footman B, PA-C  lisinopril (ZESTRIL) 2.5 MG tablet Take 2.5 mg by mouth daily. 12/21/20  Yes [provider]    Family History History reviewed. No pertinent family history.  Social History Social History   Tobacco Use   Smoking status: Never   Smokeless  tobacco: Never  Vaping Use   Vaping Use: Never used  Substance Use Topics   Alcohol use: No   Drug use: No     Allergies   Nexium [esomeprazole]   Review of Systems Review of Systems  Constitutional:  Negative for fatigue and fever.  Gastrointestinal:  Negative for abdominal pain, nausea and vomiting.  Genitourinary:  Negative for dysuria, flank pain and hematuria.  Musculoskeletal:  Positive for back pain.  Skin:  Negative for rash.  Neurological:  Negative for weakness and numbness.    Physical Exam Triage Vital Signs ED Triage Vitals  Enc Vitals Group     BP 02/22/21 1502 137/60     Pulse Rate 02/22/21 1502 62     Resp 02/22/21 1502 16     Temp 02/22/21 1502 98.7 F (37.1 C)     Temp Source 02/22/21 1502 Oral     SpO2 02/22/21 1502 97 %     Weight 02/22/21 1500 218 lb 0.6 oz (98.9 kg)     Height 02/22/21 1500 '5\' 10"'$  (1.778 m)     Head Circumference --      Peak Flow --      Pain Score 02/22/21 1500 4     Pain Loc --  Pain Edu? --      Excl. in [place]? --    No data found.  Updated Vital Signs BP 137/60 (BP Location: Right Arm)   Pulse 62   Temp 98.7 F (37.1 C) (Oral)   Resp 16   Ht '5\' 10"'$  (1.778 m)   Wt 218 lb 0.6 oz (98.9 kg)   SpO2 97%   BMI 31.28 kg/m       Physical Exam Vitals and nursing note reviewed.  Constitutional:      General: He is not in acute distress.    Appearance: Normal appearance. He is well-developed. He is not ill-appearing.  HENT:     Head: Normocephalic and atraumatic.  Eyes:     General: No scleral icterus.    Conjunctiva/sclera: Conjunctivae normal.  Cardiovascular:     Rate and Rhythm: Normal rate and regular rhythm.     Heart sounds: Normal heart sounds.  Pulmonary:     Effort: Pulmonary effort is normal. No respiratory distress.     Breath sounds: Normal breath sounds.  Abdominal:     Palpations: Abdomen is soft.     Tenderness: There is no abdominal tenderness.  Musculoskeletal:     Cervical back: Neck  supple.     Lumbar back: Tenderness (TTP bilateral paralumbar muscles with associated stiffness) present. No bony tenderness. Normal range of motion.  Skin:    General: Skin is warm and dry.  Neurological:     General: No focal deficit present.     Mental Status: He is alert. Mental status is at baseline.     Motor: No weakness.     Gait: Gait normal.  Psychiatric:        Mood and Affect: Mood normal.        Behavior: Behavior normal.        Thought Content: Thought content normal.     UC Treatments / Results  Labs (all labs ordered are listed, but only abnormal results are displayed) Labs Reviewed - No data to display  EKG   Radiology No results found.  Procedures Procedures (including critical care time)  Medications Ordered in UC Medications  ketorolac (TORADOL) injection 30 mg (30 mg Intramuscular Given 02/22/21 1537)    Initial Impression / Assessment and Plan / UC Course  I have reviewed the triage vital signs and the nursing notes.  Pertinent labs & imaging results that were available during my care of the patient were reviewed by me and considered in my medical decision making (see chart for details).  75 year old male presenting for lower back pain off and on for the past 2 weeks.  Patient reports improvement with ketorolac injection that he received for last office visit on 02/16/2021.  Patient reports no worsening symptoms.  No red flag signs or symptoms.  He does request another ketorolac injection and to try muscle relaxer at this time.  Patient given 30 mg IM ketorolac.  Normal renal function reviewed from labs 2 months ago.  Sent in baclofen.  Advised him to be careful with medications that can cause drowsiness.  Also advised Tylenol for pain relief, heating pad, and use of lidocaine patches.  Advised patient he may need to follow-up with his PCP or orthopedics if not improving over the next 1 to 2 weeks or if symptoms are worsening.   Final Clinical  Impressions(s) / UC Diagnoses   Final diagnoses:  Acute bilateral low back pain without sciatica     Discharge Instructions  BACK PAIN: Stressed avoiding painful activities . RICE (REST, ICE, COMPRESSION, ELEVATION) guidelines reviewed. May alternate ice and heat. Consider use of muscle rubs, Salonpas patches, lidocaine patches, etc. Use medications as directed including muscle relaxers if prescribed. Take anti-inflammatory medications as prescribed or OTC NSAIDs/Tylenol.  F/u with PCP in 7-10 days for reexamination, and please feel free to call or return to the urgent care at any time for any questions or concerns you may have and we will be happy to help you!  As we discussed if back pain goes beyond 4 to 6 weeks he may need to follow-up with orthopedics.  If you do not get better in the next week or so then I would go to [facility] walk-in urgent care in [place] or follow-up with your primary care provider.  You may need an MRI.  BACK PAIN RED FLAGS: If the back pain acutely worsens or there are any red flag symptoms such as numbness/tingling, leg weakness, saddle anesthesia, or loss of bowel/bladder control, go immediately to the ER. Follow up with [place] as scheduled or sooner if the pain does not begin to resolve or if it worsens before the follow up       ED Prescriptions     Medication Sig Dispense Auth. Provider   baclofen (LIORESAL) 10 MG tablet Take 1 tablet (10 mg total) by mouth 3 (three) times daily as needed for muscle spasms. 30 each Danton Clap, PA-C      I have reviewed the PDMP during this encounter.   Danton Clap, PA-C 02/22/21 [phone]

## 2021-05-04 ENCOUNTER — Ambulatory Visit (INDEPENDENT_AMBULATORY_CARE_PROVIDER_SITE_OTHER): Payer: Medicare Other

## 2021-05-04 ENCOUNTER — Ambulatory Visit
Admission: EM | Admit: 2021-05-04 | Discharge: 2021-05-04 | Disposition: A | Discharge status: Home / Self Care | Payer: Medicare Other | Attending: Physician Assistant | Admitting: Physician Assistant

## 2021-05-04 ENCOUNTER — Other Ambulatory Visit: Payer: Self-pay

## 2021-05-04 DIAGNOSIS — J069 Acute upper respiratory infection, unspecified: Secondary | ICD-10-CM

## 2021-05-04 DIAGNOSIS — M79641 Pain in right hand: Secondary | ICD-10-CM

## 2021-05-04 DIAGNOSIS — R0981 Nasal congestion: Secondary | ICD-10-CM

## 2021-05-04 NOTE — Discharge Instructions (Addendum)
URI/COLD SYMPTOMS: Your exam today is consistent with a viral illness. Antibiotics are not indicated at this time. Use medications as directed, including cough syrup, nasal saline, and decongestants. Your symptoms should improve over the next few days and resolve within 7-10 days. Increase rest and fluids. F/u if symptoms worsen or predominate such as sore throat, ear pain, productive cough, shortness of breath, or if you develop high fevers or worsening fatigue over the next several days.    SPRAIN: X-ray shows arthritis of thumb. Stressed avoiding painful activities . Reviewed RICE guidelines. Use medications as directed, including NSAIDs. If no NSAIDs have been prescribed for you today, you may take Aleve or Motrin over the counter. May use Tylenol in between doses of NSAIDs.  Voltaren gel. ACE wrap/brace. If no improvement in the next 1-2 weeks, f/u with PCP or [facility] walk in urgent care in [place]

## 2021-05-04 NOTE — ED Triage Notes (Signed)
Pt states several days of right hand pain.  Pain to lateral aspect of palm and 4th, 5th fingers along with swelling. Also reports some congestion and cough since Sunday but seems to be a lot better.

## 2021-05-04 NOTE — ED Provider Notes (Signed)
[facility]    CSN: [id number] Arrival date & time: 05/04/21  1059      History   Chief Complaint Chief Complaint  Patient presents with   Hand Pain   Nasal Congestion    HPI Jon Gordon is a 75 y.o. male presenting for pain and swelling of the palm for the past few days.  He denies any injury.  Admits to increased pain whenever he flexes his fourth and fifth digits.  No numbness, weakness or tingling.  Has been taking Tylenol for pain relief.  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  Denies similar problems like this before.  He has not had any neck pain or pain further up the arm.  Additionally complains of nasal congestion and cough over the past 6 days.  He says he is 85% better though.  His wife started to become sick with a sore throat today.  He admits to continued scratchy throat.  Has been taking DayQuil and NyQuil for symptoms with improvement.  No COVID exposure and has had 2 negative at home COVID tests.  Denies fever or breathing difficulty.  No other concerns.  HPI  Past Medical History:  Diagnosis Date   Cancer ([place])    Hypertension    Low back pain     There are no problems to display for this patient.   Past Surgical History:  Procedure Laterality Date   PORT A CATH INJECTION ([facility] HX)     Removal Spleen     Right Knee Arthorscopy     TONSILLECTOMY         Home Medications    Prior to Admission medications   Medication Sig Start Date End Date Taking? Authorizing Provider  baclofen (LIORESAL) 10 MG tablet Take 1 tablet (10 mg total) by mouth 3 (three) times daily as needed for muscle spasms. 02/22/21   Laurene Footman B, PA-C  lisinopril (ZESTRIL) 2.5 MG tablet Take 2.5 mg by mouth daily. 12/21/20   [provider]    Family History History reviewed. No pertinent family history.  Social History Social History   Tobacco Use   Smoking status: Never   Smokeless tobacco: Never  Vaping  Use   Vaping Use: Never used  Substance Use Topics   Alcohol use: No   Drug use: No     Allergies   Nexium [esomeprazole]   Review of Systems Review of Systems  Constitutional:  Negative for fatigue and fever.  HENT:  Positive for congestion and rhinorrhea. Negative for sinus pressure, sinus pain and sore throat.   Respiratory:  Negative for cough and shortness of breath.   Gastrointestinal:  Negative for abdominal pain, diarrhea, nausea and vomiting.  Musculoskeletal:  Positive for arthralgias and joint swelling. Negative for myalgias.  Neurological:  Negative for weakness, light-headedness and headaches.  Hematological:  Negative for adenopathy.    Physical Exam Triage Vital Signs ED Triage Vitals  Enc Vitals Group     BP 05/04/21 1134 134/60     Pulse Rate 05/04/21 1134 64     Resp 05/04/21 1134 16     Temp 05/04/21 1134 98.3 F (36.8 C)     Temp Source 05/04/21 1134 Oral     SpO2 05/04/21 1134 97 %     Weight 05/04/21 1131 210 lb (95.3 kg)     Height 05/04/21 1131 5\' 10"  (1.778 m)     Head Circumference --  Peak Flow --      Pain Score 05/04/21 1130 5     Pain Loc --      Pain Edu? --      Excl. in [place]? --    No data found.  Updated Vital Signs BP 134/60 (BP Location: Left Arm)   Pulse 64   Temp 98.3 F (36.8 C) (Oral)   Resp 16   Ht 5\' 10"  (1.778 m)   Wt 210 lb (95.3 kg)   SpO2 97%   BMI 30.13 kg/m      Physical Exam Vitals and nursing note reviewed.  Constitutional:      General: He is not in acute distress.    Appearance: Normal appearance. He is well-developed. He is not ill-appearing.  HENT:     Head: Normocephalic and atraumatic.     Nose: Congestion present.     Mouth/Throat:     Mouth: Mucous membranes are moist.     Pharynx: Oropharynx is clear.  Eyes:     General: No scleral icterus.    Conjunctiva/sclera: Conjunctivae normal.  Cardiovascular:     Rate and Rhythm: Normal rate and regular rhythm.     Heart sounds: Normal heart  sounds.  Pulmonary:     Effort: Pulmonary effort is normal. No respiratory distress.     Breath sounds: Normal breath sounds.  Musculoskeletal:     Cervical back: Neck supple.     Comments: RIGHT HAND: there is swelling that is moderate of the palmar ulnar aspect of hand. TTP to this area. TTP MCP of 4th and 5th MCPs. Pain with movement of 4th and 5th digits.  No swelling of fourth or fifth digits.  Full range of motion of fourth and fifth digits.  Full strength and good pulses.  Skin:    General: Skin is warm and dry.  Neurological:     General: No focal deficit present.     Mental Status: He is alert. Mental status is at baseline.     Motor: No weakness.     Gait: Gait normal.  Psychiatric:        Mood and Affect: Mood normal.        Behavior: Behavior normal.        Thought Content: Thought content normal.     UC Treatments / Results  Labs (all labs ordered are listed, but only abnormal results are displayed) Labs Reviewed - No data to display  EKG   Radiology DG Hand Complete Right  Result Date: 05/04/2021 CLINICAL DATA:  Right hand pain, fourth and fifth digits EXAM: RIGHT HAND - COMPLETE 3+ VIEW COMPARISON:  None. FINDINGS: There is no evidence of fracture or dislocation. Moderate thumb metacarpophalangeal joint arthrosis. Joint spaces are otherwise preserved. Soft tissues are unremarkable. IMPRESSION: No fracture or dislocation of the right hand. Moderate thumb metacarpophalangeal joint arthrosis. Electronically Signed   By: Delanna Ahmadi M.D.   On: 05/04/2021 11:55    Procedures Procedures (including critical care time)  Medications Ordered in UC Medications - No data to display  Initial Impression / Assessment and Plan / UC Course  I have reviewed the triage vital signs and the nursing notes.  Pertinent labs & imaging results that were available during my care of the patient were reviewed by me and considered in my medical decision making (see chart for  details).  75 year old male presenting for right hand pain and swelling for the past couple of days.  No injury.  X-ray obtained today  reveals moderate thumb MCP joint arthrosis, otherwise no significant findings.  Discussed this with patient.  Suspect inflammation and minor musculoskeletal condition.  Advised treatment with Tylenol, low-dose Aleve, Voltaren, Ace wrap which we have provided, ice and rest and follow-up with PCP or Ortho if not improving over the next week.  Patient reports 85% improvement in cough and congestion.  Symptoms consistent with viral URI.  Advised him to continue with the medications that he has been taking his symptoms should be resolved in the next few days.  Advised him to return for fever, worsening cough or breathing issue.   Final Clinical Impressions(s) / UC Diagnoses   Final diagnoses:  Right hand pain  Viral upper respiratory tract infection  Nasal congestion     Discharge Instructions      URI/COLD SYMPTOMS: Your exam today is consistent with a viral illness. Antibiotics are not indicated at this time. Use medications as directed, including cough syrup, nasal saline, and decongestants. Your symptoms should improve over the next few days and resolve within 7-10 days. Increase rest and fluids. F/u if symptoms worsen or predominate such as sore throat, ear pain, productive cough, shortness of breath, or if you develop high fevers or worsening fatigue over the next several days.    SPRAIN: X-ray shows arthritis of thumb. Stressed avoiding painful activities . Reviewed RICE guidelines. Use medications as directed, including NSAIDs. If no NSAIDs have been prescribed for you today, you may take Aleve or Motrin over the counter. May use Tylenol in between doses of NSAIDs.  Voltaren gel. ACE wrap/brace. If no improvement in the next 1-2 weeks, f/u with PCP or [facility] walk in urgent care in [facility]     ED Prescriptions   None    PDMP not reviewed this  encounter.   Danton Clap, PA-C 05/04/21 1300

## 2022-05-05 ENCOUNTER — Ambulatory Visit
Admission: EM | Admit: 2022-05-05 | Discharge: 2022-05-05 | Disposition: A | Discharge status: Home / Self Care | Payer: Medicare Other | Attending: Emergency Medicine | Admitting: Emergency Medicine

## 2022-05-05 DIAGNOSIS — M5431 Sciatica, right side: Secondary | ICD-10-CM

## 2022-05-05 DIAGNOSIS — S76211A Strain of adductor muscle, fascia and tendon of right thigh, initial encounter: Secondary | ICD-10-CM

## 2022-05-05 MED ORDER — PREDNISONE 10 MG (21) PO TBPK: ORAL_TABLET | ORAL | 0 refills | Status: DC

## 2022-05-05 MED ORDER — BACLOFEN 10 MG PO TABS: 10.0000 mg | ORAL_TABLET | Freq: Three times a day (TID) | ORAL | 0 refills | Status: DC

## 2022-05-05 MED ORDER — DEXAMETHASONE SODIUM PHOSPHATE 10 MG/ML IJ SOLN
10.0000 mg | Freq: Once | INTRAMUSCULAR | Status: AC
  Administered 2022-05-05: 10 mg via INTRAMUSCULAR

## 2022-05-05 NOTE — ED Triage Notes (Signed)
Pt states he thinks he pulled a muscle in lower back. Pt states he was doing an exercise and feels pain radiating into RT leg, has been icing

## 2022-05-05 NOTE — ED Provider Notes (Signed)
[facility]    CSN: [id number] Arrival date & time: 05/05/22  0815      History   Chief Complaint Chief Complaint  Patient presents with   Back Pain    HPI Jon Gordon is a 76 y.o. male.   HPI  76 year old male here for evaluation of low back pain.  Patient reports that he was in bed last night doing his stretches and he felt a pull in his low back and right buttock.  This morning when he woke up he had increased pain in the also has difficulty walking because the pain causes his right leg to give out.  He denies any numbness or weakness.  No other injuries or heavy lifting.  He has had sciatica in the past.  In addition, he is complaining of pain on the inside of his right thigh which she has not had before.  He describes the pain as a burning in his right buttock and states that it goes all the way down to his right ankle.  Past Medical History:  Diagnosis Date   Cancer ([place])    Hypertension    Low back pain     There are no problems to display for this patient.   Past Surgical History:  Procedure Laterality Date   PORT A CATH INJECTION ([facility] HX)     Removal Spleen     Right Knee Arthorscopy     TONSILLECTOMY         Home Medications    Prior to Admission medications   Medication Sig Start Date End Date Taking? Authorizing Provider  baclofen (LIORESAL) 10 MG tablet Take 1 tablet (10 mg total) by mouth 3 (three) times daily. 05/05/22  Yes Margarette Canada, NP  lisinopril (ZESTRIL) 2.5 MG tablet Take 2.5 mg by mouth daily. 12/21/20  Yes [provider]  predniSONE (STERAPRED UNI-PAK 21 TAB) 10 MG (21) TBPK tablet Take 6 tablets on day 1, 5 tablets day 2, 4 tablets day 3, 3 tablets day 4, 2 tablets day 5, 1 tablet day 6 05/05/22  Yes Margarette Canada, NP    Family History History reviewed. No pertinent family history.  Social History Social History   Tobacco Use   Smoking status: Never   Smokeless tobacco: Never  Vaping Use   Vaping  Use: Never used  Substance Use Topics   Alcohol use: No   Drug use: No     Allergies   Nexium [esomeprazole]   Review of Systems Review of Systems  Constitutional:  Negative for fever.  Musculoskeletal:  Positive for arthralgias and back pain.       Pain in right buttock with radiation down the right leg.  Skin:  Negative for color change.  Neurological:  Negative for weakness and numbness.  Hematological: Negative.   Psychiatric/Behavioral: Negative.       Physical Exam Triage Vital Signs ED Triage Vitals  Enc Vitals Group     BP 05/05/22 0833 (!) 141/68     Pulse Rate 05/05/22 0833 (!) 55     Resp --      Temp 05/05/22 0833 97.9 F (36.6 C)     Temp Source 05/05/22 0833 Oral     SpO2 05/05/22 0833 98 %     Weight 05/05/22 0832 230 lb (104.3 kg)     Height 05/05/22 0832 '5\' 10"'$  (1.778 m)     Head Circumference --      Peak Flow --  Pain Score 05/05/22 0831 3     Pain Loc --      Pain Edu? --      Excl. in [place]? --    No data found.  Updated Vital Signs BP (!) 141/68 (BP Location: Left Arm)   Pulse (!) 55   Temp 97.9 F (36.6 C) (Oral)   Ht '5\' 10"'$  (1.778 m)   Wt 230 lb (104.3 kg)   SpO2 98%   BMI 33.00 kg/m   Visual Acuity Right Eye Distance:   Left Eye Distance:   Bilateral Distance:    Right Eye Near:   Left Eye Near:    Bilateral Near:     Physical Exam Vitals and nursing note reviewed.  Constitutional:      Appearance: Normal appearance. He is not ill-appearing.  HENT:     Head: Normocephalic and atraumatic.  Cardiovascular:     Rate and Rhythm: Normal rate and regular rhythm.     Pulses: Normal pulses.     Heart sounds: Normal heart sounds. No murmur heard.    No friction rub. No gallop.  Pulmonary:     Effort: Pulmonary effort is normal.     Breath sounds: Normal breath sounds. No wheezing, rhonchi or rales.  Musculoskeletal:        General: Tenderness present. No swelling.     Comments: Patient is tenderness with palpation of  the right buttock along the path of the sciatic nerve.  Muscle tension is appreciated but no overt spasm.  Patient has a positive straight leg raise on the right but not the left.  He also complains of pain on the inner aspect of his right superior thigh.  No muscle spasm or defect noted.  There is muscle tension present.  With passive range of motion I can get the patient's hip flexed to 90 degrees without any discomfort.  He states with external rotation he feels the pain in his right buttock and with internal rotation he feels it in his right medial, superior thigh.  Skin:    General: Skin is warm and dry.     Capillary Refill: Capillary refill takes less than 2 seconds.     Findings: No bruising or erythema.  Neurological:     General: No focal deficit present.     Mental Status: He is alert and oriented to person, place, and time.     Sensory: No sensory deficit.     Motor: No weakness.     Deep Tendon Reflexes: Reflexes normal.  Psychiatric:        Mood and Affect: Mood normal.        Behavior: Behavior normal.        Thought Content: Thought content normal.        Judgment: Judgment normal.      UC Treatments / Results  Labs (all labs ordered are listed, but only abnormal results are displayed) Labs Reviewed - No data to display  EKG   Radiology No results found.  Procedures Procedures (including critical care time)  Medications Ordered in UC Medications  dexamethasone (DECADRON) injection 10 mg (has no administration in time range)    Initial Impression / Assessment and Plan / UC Course  I have reviewed the triage vital signs and the nursing notes.  Pertinent labs & imaging results that were available during my care of the patient were reviewed by me and considered in my medical decision making (see chart for details).   Patient presents for  evaluation of pain in his right buttocks that radiates down the outside of his leg to his ankle and has been present since  this morning.  Patient is able to stand and bear weight but has increased pain with ambulation and range of motion of his right leg.  I assisted the patient up onto the exam table.  He has no pain with palpation of the midline of his lumbar spine nor with palpation of the paraspinous regions.  He does have some tension and tenderness with palpation along the sciatic nerve tract in the right buttock.  Straight leg raise is positive on the right as described above.  There is no swelling, erythema, or bruising noticed to the low back, hip, or thigh.  DP and PT pulses are 2+.  I suspect that patient has inflamed his sciatic nerve with muscles in his gluteus region and also has a mild right groin strain.  I will give the patient home physical therapy exercises to do and I have asked him to touch base with his current physical therapist for other exercise options.  I will give the patient a shot of Decadron in clinic and discharge him home on a prednisone pack as well as baclofen.  Return precautions reviewed.   Final Clinical Impressions(s) / UC Diagnoses   Final diagnoses:  Sciatica of right side  Inguinal strain, right, initial encounter     Discharge Instructions      Take the prednisone according to the package instructions.  You will take it each morning with breakfast.  Make sure that you are always taking it with food.  Take the baclofen every 8 hours as needed for muscle spasm.  Follow the rehabilitation exercises in your discharge paperwork to help you with your sciatic pain.  Return for reevaluation for new or worsening symptoms, or see your primary care provider.      ED Prescriptions     Medication Sig Dispense Auth. Provider   predniSONE (STERAPRED UNI-PAK 21 TAB) 10 MG (21) TBPK tablet Take 6 tablets on day 1, 5 tablets day 2, 4 tablets day 3, 3 tablets day 4, 2 tablets day 5, 1 tablet day 6 21 tablet Margarette Canada, NP   baclofen (LIORESAL) 10 MG tablet Take 1 tablet (10 mg  total) by mouth 3 (three) times daily. 80 each Margarette Canada, NP      PDMP not reviewed this encounter.   Margarette Canada, NP 05/05/22 0930

## 2022-05-05 NOTE — Discharge Instructions (Addendum)
Take the prednisone according to the package instructions.  You will take it each morning with breakfast.  Make sure that you are always taking it with food.  Take the baclofen every 8 hours as needed for muscle spasm.  Follow the rehabilitation exercises in your discharge paperwork to help you with your sciatic pain.  Return for reevaluation for new or worsening symptoms, or see your primary care provider.  

## 2022-05-13 ENCOUNTER — Ambulatory Visit: Payer: Self-pay

## 2022-06-30 ENCOUNTER — Ambulatory Visit
Admission: EM | Admit: 2022-06-30 | Discharge: 2022-06-30 | Disposition: A | Discharge status: Home / Self Care | Payer: Medicare Other | Attending: Internal Medicine | Admitting: Internal Medicine

## 2022-06-30 DIAGNOSIS — M5431 Sciatica, right side: Secondary | ICD-10-CM

## 2022-06-30 MED ORDER — PREDNISONE 10 MG (21) PO TBPK: ORAL_TABLET | ORAL | 0 refills | Status: DC

## 2022-06-30 NOTE — ED Triage Notes (Signed)
Pt states that he has some back pain. Pt states that the pain radiates down his leg. Pt states that this is an ongoing problem.

## 2022-06-30 NOTE — ED Provider Notes (Signed)
[facility]    CSN: [id number] Arrival date & time: 06/30/22  1920      History   Chief Complaint Chief Complaint  Patient presents with   Back Pain    Back pain ongoing.     HPI Jon Gordon is a 76 y.o. male.   HPI  Past Medical History:  Diagnosis Date   Cancer ([place])    Hypertension    Low back pain     There are no problems to display for this patient.   Past Surgical History:  Procedure Laterality Date   PORT A CATH INJECTION ([facility] HX)     Removal Spleen     Right Knee Arthorscopy     TONSILLECTOMY         Home Medications    Prior to Admission medications   Medication Sig Start Date End Date Taking? Authorizing Provider  baclofen (LIORESAL) 10 MG tablet Take 1 tablet (10 mg total) by mouth 3 (three) times daily. 05/05/22  Yes Margarette Canada, NP  lisinopril (ZESTRIL) 2.5 MG tablet Take 2.5 mg by mouth daily. 12/21/20  Yes [provider]  predniSONE (STERAPRED UNI-PAK 21 TAB) 10 MG (21) TBPK tablet Take 6 tablets on day 1, 5 tablets day 2, 4 tablets day 3, 3 tablets day 4, 2 tablets day 5, 1 tablet day 6 05/05/22   Margarette Canada, NP    Family History History reviewed. No pertinent family history.  Social History Social History   Tobacco Use   Smoking status: Never   Smokeless tobacco: Never  Vaping Use   Vaping Use: Never used  Substance Use Topics   Alcohol use: No   Drug use: No     Allergies   Nexium [esomeprazole]   Review of Systems Review of Systems   Physical Exam Triage Vital Signs ED Triage Vitals  Enc Vitals Group     BP 06/30/22 2004 (!) 142/61     Pulse Rate 06/30/22 2004 70     Resp 06/30/22 2004 18     Temp 06/30/22 2004 99.1 F (37.3 C)     Temp Source 06/30/22 2004 Oral     SpO2 06/30/22 2004 95 %     Weight 06/30/22 2001 238 lb (108 kg)     Height 06/30/22 2001 '5\' 10"'$  (1.778 m)     Head Circumference --      Peak Flow --      Pain Score 06/30/22 2001 0     Pain Loc --      Pain  Edu? --      Excl. in [place]? --    No data found.  Updated Vital Signs BP (!) 142/61 (BP Location: Left Arm)   Pulse 70   Temp 99.1 F (37.3 C) (Oral)   Resp 18   Ht '5\' 10"'$  (1.778 m)   Wt 108 kg   SpO2 95%   BMI 34.15 kg/m   Visual Acuity Right Eye Distance:   Left Eye Distance:   Bilateral Distance:    Right Eye Near:   Left Eye Near:    Bilateral Near:     Physical Exam   UC Treatments / Results  Labs (all labs ordered are listed, but only abnormal results are displayed) Labs Reviewed - No data to display  EKG   Radiology No results found.  Procedures Procedures (including critical care time)  Medications Ordered in UC Medications - No data to display  Initial Impression / Assessment and  Plan / UC Course  I have reviewed the triage vital signs and the nursing notes.  Pertinent labs & imaging results that were available during my care of the patient were reviewed by me and considered in my medical decision making (see chart for details).     *** Final Clinical Impressions(s) / UC Diagnoses   Final diagnoses:  None   Discharge Instructions   None    ED Prescriptions   None    PDMP not reviewed this encounter.

## 2022-06-30 NOTE — Discharge Instructions (Addendum)
Apply cold compresses intermittently as needed.   As pain recedes, begin normal activities slowly as tolerated. Take medications as prescribed No indication for imaging studies at this time. Please follow-up with your primary care provider or return to urgent care if you have any other concerns.

## 2022-07-09 ENCOUNTER — Ambulatory Visit
Admission: EM | Admit: 2022-07-09 | Discharge: 2022-07-09 | Disposition: A | Discharge status: Home / Self Care | Payer: Medicare Other | Attending: Nurse Practitioner | Admitting: Nurse Practitioner

## 2022-07-09 DIAGNOSIS — G8929 Other chronic pain: Secondary | ICD-10-CM | POA: Diagnosis not present

## 2022-07-09 DIAGNOSIS — M5441 Lumbago with sciatica, right side: Secondary | ICD-10-CM | POA: Diagnosis not present

## 2022-07-09 MED ORDER — PREDNISONE 10 MG (21) PO TBPK
ORAL_TABLET | ORAL | 0 refills | Status: DC
Start: 2022-07-09 — End: 2022-12-19

## 2022-07-09 NOTE — Discharge Instructions (Signed)
Prednisone taper as prescribed Continue Tylenol as needed Heat to the low back Follow-up with your PCP as scheduled appointment on the 27th Please go to the emergency room for any worsening symptoms

## 2022-07-09 NOTE — ED Triage Notes (Signed)
Pt c/o back pain, was seen on 12/11 for the same issue & was given a prednisone PAK. States that helped ease the pain but it has returned. Pain radiating down legs. Has appt sch w/ortho spine on 12/27 & needs something for relief until he sees them.

## 2022-07-09 NOTE — ED Provider Notes (Signed)
[facility]    CSN: [id number] Arrival date & time: 07/09/22  1739      History   Chief Complaint Chief Complaint  Patient presents with   Back Pain    HPI Jon Gordon is a 76 y.o. male  for evaluation of low back pain for several months. Patient has a history of chronic low back pain with sciatica.  He was recently seen by his PCP and prescribed a compound cream that has not been approved by his insurance.  He was on a prednisone taper and baclofen which he is now out of.  He is unable to take NSAIDs due to history of GI bleed.  He states that was the only thing that helped him.  He has an appointment with orthopedist on 12/27.Patient denies fever, chills, abdominal pain, flank pain, dysuria, urinary frequency/urgency, numbness/tingling/weakness of the lower extremities. No bowel and bladder incontinence. No saddle paraesthesia. Patient has used Tylenol OTC medications that have helped. No other c/o today.    Back Pain   Past Medical History:  Diagnosis Date   Cancer ([facility])    Hypertension    Low back pain     There are no problems to display for this patient.   Past Surgical History:  Procedure Laterality Date   PORT A CATH INJECTION ([facility] HX)     Removal Spleen     Right Knee Arthorscopy     TONSILLECTOMY         Home Medications    Prior to Admission medications   Medication Sig Start Date End Date Taking? Authorizing Provider  baclofen (LIORESAL) 10 MG tablet Take 1 tablet (10 mg total) by mouth 3 (three) times daily. 05/05/22  Yes Margarette Canada, NP  lisinopril (ZESTRIL) 2.5 MG tablet Take 2.5 mg by mouth daily. 12/21/20  Yes [provider]  predniSONE (STERAPRED UNI-PAK 21 TAB) 10 MG (21) TBPK tablet Take 6 tablets on day 1, 5 tablets day 2, 4 tablets day 3, 3 tablets day 4, 2 tablets day 5, 1 tablet day 6 07/09/22   Melynda Ripple, NP    Family History History reviewed. No pertinent family history.  Social History Social History    Tobacco Use   Smoking status: Never   Smokeless tobacco: Never  Vaping Use   Vaping Use: Never used  Substance Use Topics   Alcohol use: No   Drug use: No     Allergies   Aspirin and Nexium [esomeprazole]   Review of Systems Review of Systems  Musculoskeletal:  Positive for back pain.     Physical Exam Triage Vital Signs ED Triage Vitals  Enc Vitals Group     BP 07/09/22 1910 (!) 132/56     Pulse Rate 07/09/22 1910 66     Resp 07/09/22 1910 16     Temp 07/09/22 1910 98.4 F (36.9 C)     Temp Source 07/09/22 1910 Oral     SpO2 07/09/22 1910 94 %     Weight 07/09/22 1908 237 lb (107.5 kg)     Height 07/09/22 1908 '5\' 10"'$  (1.778 m)     Head Circumference --      Peak Flow --      Pain Score 07/09/22 1908 2     Pain Loc --      Pain Edu? --      Excl. in [place]? --    No data found.  Updated Vital Signs BP (!) 132/56 (BP Location: Left  Arm)   Pulse 66   Temp 98.4 F (36.9 C) (Oral)   Resp 16   Ht '5\' 10"'$  (1.778 m)   Wt 237 lb (107.5 kg)   SpO2 94%   BMI 34.01 kg/m   Visual Acuity Right Eye Distance:   Left Eye Distance:   Bilateral Distance:    Right Eye Near:   Left Eye Near:    Bilateral Near:     Physical Exam Vitals and nursing note reviewed.  Constitutional:      Appearance: Normal appearance.  HENT:     Head: Normocephalic and atraumatic.  Eyes:     Pupils: Pupils are equal, round, and reactive to light.  Cardiovascular:     Rate and Rhythm: Normal rate.  Pulmonary:     Effort: Pulmonary effort is normal.  Musculoskeletal:     Thoracic back: Normal.     Lumbar back: Tenderness present. No swelling or bony tenderness. Positive right straight leg raise test. Negative left straight leg raise test. No scoliosis.       Back:     Comments: Mild tenderness palpation to right paralumbar muscles.  Strength 5 out of 5 bilateral lower extremities.  Skin:    General: Skin is warm and dry.  Neurological:     General: No focal deficit present.      Mental Status: He is alert and oriented to person, place, and time.     Deep Tendon Reflexes:     Reflex Scores:      Patellar reflexes are 2+ on the right side and 2+ on the left side. Psychiatric:        Mood and Affect: Mood normal.        Behavior: Behavior normal.      UC Treatments / Results  Labs (all labs ordered are listed, but only abnormal results are displayed) Labs Reviewed - No data to display  EKG   Radiology No results found.  Procedures Procedures (including critical care time)  Medications Ordered in UC Medications - No data to display  Initial Impression / Assessment and Plan / UC Course  I have reviewed the triage vital signs and the nursing notes.  Pertinent labs & imaging results that were available during my care of the patient were reviewed by me and considered in my medical decision making (see chart for details).     Reviewed exam with patient.  No red flags on exam Will restart prednisone taper to get patient through to his 12/27 appointment with orthopedist.  He may continue Tylenol as needed No aspirin or NSAIDs due to history of GI bleed Heat to the low back Rest Follow-up with PCP 2 to 3 days for recheck Final Clinical Impressions(s) / UC Diagnoses   Final diagnoses:  Chronic bilateral low back pain with right-sided sciatica     Discharge Instructions      Prednisone taper as prescribed Continue Tylenol as needed Heat to the low back Follow-up with your PCP as scheduled appointment on the 27th Please go to the emergency room for any worsening symptoms   ED Prescriptions     Medication Sig Dispense Auth. Provider   predniSONE (STERAPRED UNI-PAK 21 TAB) 10 MG (21) TBPK tablet Take 6 tablets on day 1, 5 tablets day 2, 4 tablets day 3, 3 tablets day 4, 2 tablets day 5, 1 tablet day 6 21 tablet Melynda Ripple, NP      PDMP not reviewed this encounter.   Melynda Ripple,  NP 07/09/22 1930

## 2022-07-23 ENCOUNTER — Other Ambulatory Visit: Payer: Self-pay | Admitting: Internal Medicine

## 2022-07-23 ENCOUNTER — Ambulatory Visit: Payer: Medicare Other | Attending: Internal Medicine

## 2022-07-23 DIAGNOSIS — M79662 Pain in left lower leg: Secondary | ICD-10-CM

## 2022-08-16 IMAGING — CR DG HAND COMPLETE 3+V*R*
3 series · 3 of 3 positions shown · non-contrast
Comparison: None.

CLINICAL DATA: Right hand pain, fourth and fifth digits

EXAM:
RIGHT HAND - COMPLETE 3+ VIEW

[hand ap]
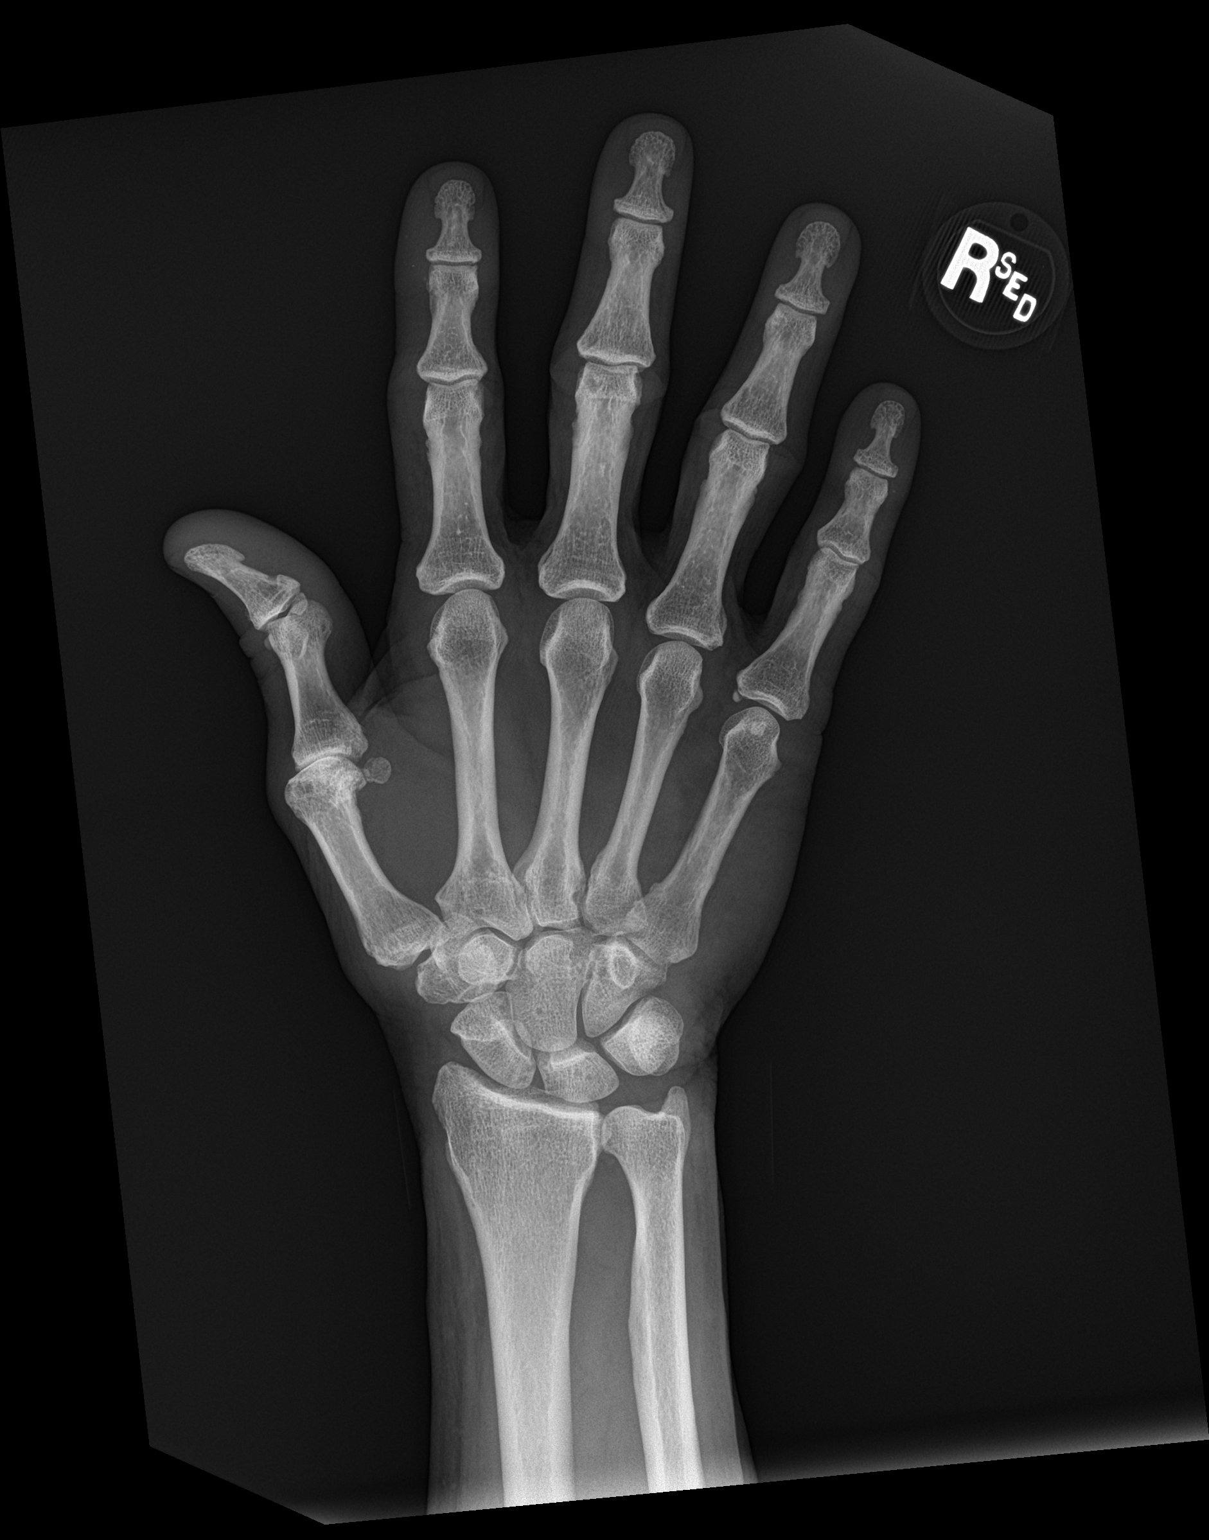

[hand obl]
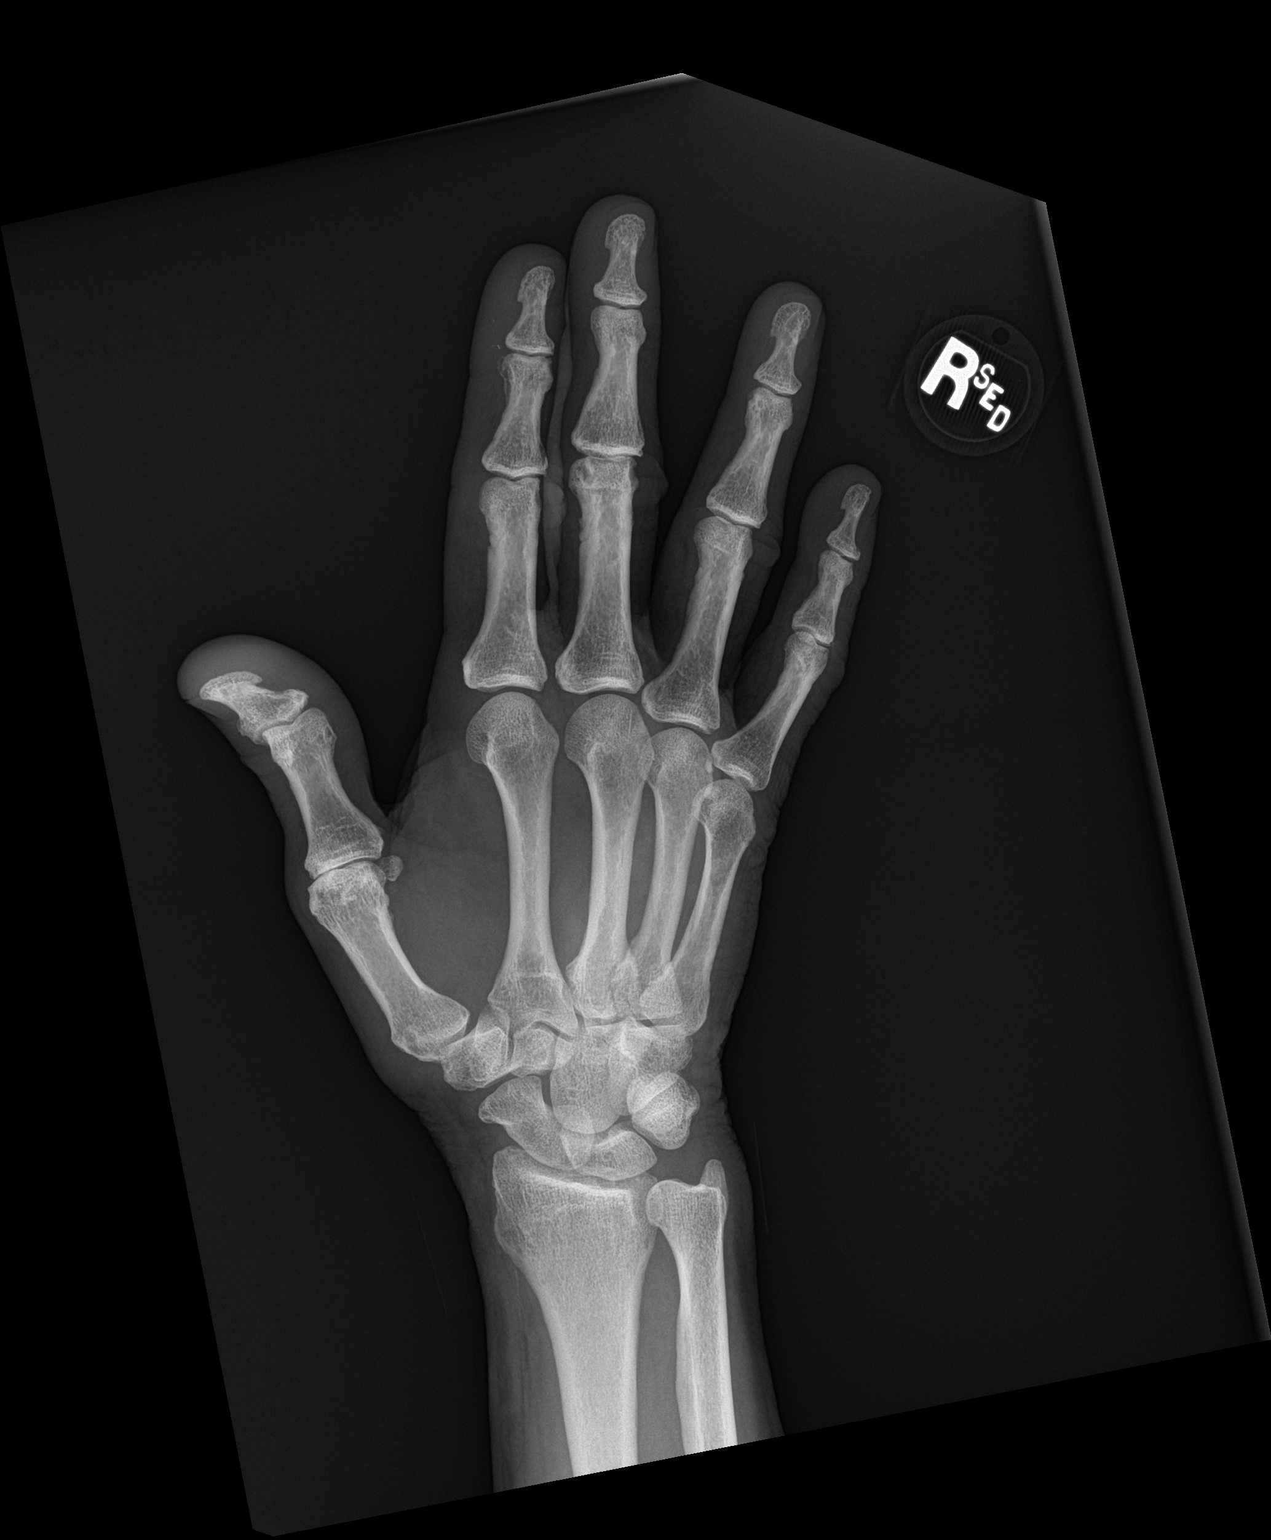

[hand lat]
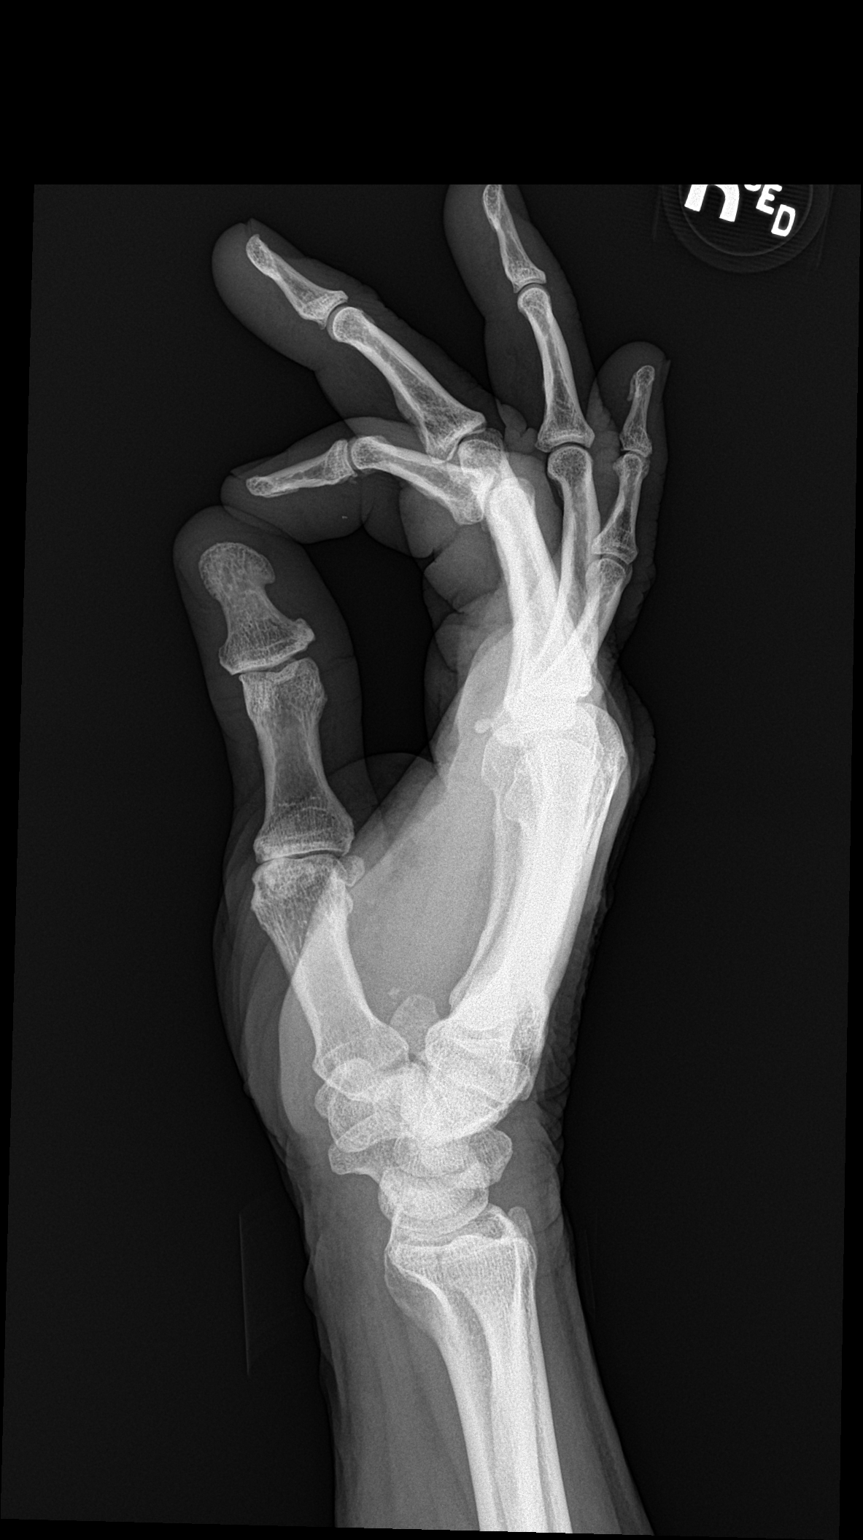

[3 of 3 positions shown; findings below may reference images not displayed]

FINDINGS: There is no evidence of fracture or dislocation. Moderate thumb
metacarpophalangeal joint arthrosis. Joint spaces are otherwise
preserved. Soft tissues are unremarkable.
IMPRESSION: No fracture or dislocation of the right hand. Moderate thumb
metacarpophalangeal joint arthrosis.

## 2022-12-19 ENCOUNTER — Other Ambulatory Visit
Admission: RE | Admit: 2022-12-19 | Discharge: 2022-12-19 | Disposition: A | Discharge status: Home / Self Care | Payer: Medicare (Managed Care) | Attending: Urology | Admitting: Urology

## 2022-12-19 ENCOUNTER — Other Ambulatory Visit: Payer: Self-pay

## 2022-12-19 ENCOUNTER — Ambulatory Visit: Payer: Medicare Other | Admitting: Urology

## 2022-12-19 ENCOUNTER — Encounter: Payer: Self-pay | Admitting: Urology

## 2022-12-19 VITALS — BP 147/56 | HR 72 | Ht 70.0 in | Wt 235.6 lb

## 2022-12-19 DIAGNOSIS — N400 Enlarged prostate without lower urinary tract symptoms: Secondary | ICD-10-CM

## 2022-12-19 DIAGNOSIS — N401 Enlarged prostate with lower urinary tract symptoms: Secondary | ICD-10-CM | POA: Insufficient documentation

## 2022-12-19 DIAGNOSIS — N50811 Right testicular pain: Secondary | ICD-10-CM

## 2022-12-19 DIAGNOSIS — N528 Other male erectile dysfunction: Secondary | ICD-10-CM | POA: Diagnosis not present

## 2022-12-19 LAB — URINALYSIS, COMPLETE (UACMP) WITH MICROSCOPIC
Bilirubin Urine: NEGATIVE
Glucose, UA: NEGATIVE mg/dL
Hgb urine dipstick: NEGATIVE
Ketones, ur: NEGATIVE mg/dL
Nitrite: NEGATIVE
Protein, ur: NEGATIVE mg/dL
Specific Gravity, Urine: 1.02 (ref 1.005–1.030)
Squamous Epithelial / HPF: NONE SEEN /HPF (ref 0–5)
pH: 5.5 (ref 5.0–8.0)

## 2022-12-19 LAB — BLADDER SCAN AMB NON-IMAGING

## 2022-12-19 NOTE — Progress Notes (Signed)
Jon Gordon,acting as a scribe for Vanna Scotland, MD.,have documented all relevant documentation on the behalf of Vanna Scotland, MD,as directed by  Vanna Scotland, MD while in the presence of Vanna Scotland, MD.  12/19/2022 4:05 PM   Jon Gordon [birth year] [id number]  Referring provider: Leim Fabry, MD [address] [place],  [place] [address]  Chief Complaint  Patient presents with   Establish Care    BPH, transfer     HPI: 77 year-old male who presents today to establish care. He was previously followed in [place] at [facility] and his urologist recently left the practice. I am personally reviewing his records.   He has a personal history of asymptomatic microscopic hematuria 45 years ago at [facility]. He had another gross hematuria workup in 12/2020, including CT urogram that showed an incidental left renal stone cyst, normal cystoscopy, and prostatic enlargement.   He also had a questionable hypermetabolic left testicular paratesticular lesion in 2014 that confirmed the presence of varicocele rather than pathology. 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 He was having serial testosterone progesterone estrogens by Dr. Selena Batten. He's had issues with right testicular pain and penile ulcers as well in the past.  He also has a personal history of erectile dysfunction. He transitioned to intracavernosal injections for her erectile dysfunction and was using Trimix #56 0.3 cc.  He has a history of BPH that was previously managed with alfuzosin. He is currently no longer on any BPH medications.  His last visit was in 08/2022.   Today, he reports satisfaction with his current management but notes occasional right testicular discomfort and mild urinary symptoms.  Results for orders placed or performed in visit on 12/19/22  Bladder Scan (Post Void Residual) in office  Result Value Ref Range   Scan Result 68ml   Results for orders  placed or performed during the hospital encounter of 12/19/22  Urinalysis, Complete w Microscopic -  Result Value Ref Range   Color, Urine YELLOW YELLOW   APPearance CLEAR CLEAR   Specific Gravity, Urine 1.020 1.005 - 1.030   pH 5.5 5.0 - 8.0   Glucose, UA NEGATIVE NEGATIVE mg/dL   Hgb urine dipstick NEGATIVE NEGATIVE   Bilirubin Urine NEGATIVE NEGATIVE   Ketones, ur NEGATIVE NEGATIVE mg/dL   Protein, ur NEGATIVE NEGATIVE mg/dL   Nitrite NEGATIVE NEGATIVE   Leukocytes,Ua TRACE (A) NEGATIVE   Squamous Epithelial / HPF NONE SEEN 0 - 5 /HPF   WBC, UA 0-5 0 - 5 WBC/hpf   RBC / HPF 0-5 0 - 5 RBC/hpf   Bacteria, UA RARE (A) NONE SEEN    IPSS     Row Name 12/19/22 1500         International Prostate Symptom Score   How often have you had the sensation of not emptying your bladder? About half the time     How often have you had to urinate less than every two hours? About half the time     How often have you found you stopped and started again several times when you urinated? Less than half the time     How often have you found it difficult to postpone urination? More than half the time     How often have you had a weak urinary stream? About half the time     How often have you had to strain to start urination? About half the time  How many times did you typically get up at night to urinate? 1 Time     Total IPSS Score 19       Quality of Life due to urinary symptoms   If you were to spend the rest of your life with your urinary condition just the way it is now how would you feel about that? Mostly Satisfied              Score:  1-7 Mild 8-19 Moderate 20-35 Severe  PMH: Past Medical History:  Diagnosis Date   Cancer (HCC)    Hypertension    Low back pain     Surgical History: Past Surgical History:  Procedure Laterality Date   PORT A CATH INJECTION ([facility] HX)     Removal Spleen     Right Knee Arthorscopy     TONSILLECTOMY      Home Medications:  Allergies  as of 12/19/2022       Reactions   Aspirin Other (See Comments)   macroglobulinemia   Nexium [esomeprazole] Other (See Comments)   Bad Headaches        Medication List        Accurate as of Dec 19, 2022  4:05 PM. If you have any questions, ask your nurse or doctor.          STOP taking these medications    predniSONE 10 MG (21) Tbpk tablet Commonly known as: STERAPRED UNI-PAK 21 TAB Stopped by: Vanna Scotland, MD       TAKE these medications    baclofen 10 MG tablet Commonly known as: LIORESAL Take 1 tablet (10 mg total) by mouth 3 (three) times daily.   DULoxetine 20 MG capsule Commonly known as: CYMBALTA Take 20 mg by mouth 2 (two) times daily.   Eliquis 5 MG Tabs tablet Generic drug: apixaban Take by mouth.   lisinopril 2.5 MG tablet Commonly known as: ZESTRIL Take 2.5 mg by mouth daily.   omeprazole 40 MG capsule Commonly known as: PRILOSEC Take 40 mg by mouth daily.   polyethylene glycol powder 17 GM/SCOOP powder Commonly known as: GLYCOLAX/MIRALAX Take 17 g by mouth daily.        Allergies:  Allergies  Allergen Reactions   Aspirin Other (See Comments)    macroglobulinemia   Nexium [Esomeprazole] Other (See Comments)    Bad Headaches    Social History:  reports that he has never smoked. He has never used smokeless tobacco. He reports that he does not drink alcohol and does not use drugs.   Physical Exam: BP (!) 147/56 (BP Location: Left Arm, Patient Position: Sitting, Cuff Size: Large)   Pulse 72   Ht 5\' 10"  (1.778 m)   Wt 235 lb 9.6 oz (106.9 kg)   BMI 33.81 kg/m   Constitutional:  Alert and oriented, No acute distress. HEENT: [place] AT, moist mucus membranes.  Trachea midline, no masses. GU: Right testicular tenderness, no masses, no penile ulcers noted. Neurologic: Grossly intact, no focal deficits, moving all 4 extremities. Psychiatric: Normal mood and affect.  Assessment & Plan:    1. Right orchalgia - Consider referral to a  specialist in [facility] for further evaluation.  Discussed options including medical, block, denervation, etc  2. BPH - Currently not on medication. Monitor urinary symptoms and PVR.  - No need for PSA or rectal exams due to age.  3. Erectile dysfunction - Continue Trimix 0.3 cc. Advised to increase dose incrementally if needed. Documented pharmacy for Trimix refills  when needs refills (he will get [place] pharmacy info)  Return in about 1 year (around 12/19/2023), or if symptoms worsen or fail to improve, for IPSS, PVR.  I have reviewed the above documentation for accuracy and completeness, and I agree with the above.   Vanna Scotland, MD  [facility] [address], [address] [place], [place] [address] [phone]

## 2023-01-20 ENCOUNTER — Ambulatory Visit
Admission: EM | Admit: 2023-01-20 | Discharge: 2023-01-20 | Disposition: A | Discharge status: Home / Self Care | Payer: Medicare Other | Attending: Physician Assistant | Admitting: Physician Assistant

## 2023-01-20 DIAGNOSIS — G8929 Other chronic pain: Secondary | ICD-10-CM | POA: Diagnosis not present

## 2023-01-20 DIAGNOSIS — M5441 Lumbago with sciatica, right side: Secondary | ICD-10-CM

## 2023-01-20 MED ORDER — CYCLOBENZAPRINE HCL 10 MG PO TABS: 10.0000 mg | ORAL_TABLET | Freq: Three times a day (TID) | ORAL | 0 refills | Status: DC | PRN

## 2023-01-20 MED ORDER — PREDNISONE 20 MG PO TABS: ORAL_TABLET | ORAL | 0 refills | Status: DC

## 2023-01-20 MED ORDER — METHYLPREDNISOLONE ACETATE 80 MG/ML IJ SUSP
60.0000 mg | Freq: Once | INTRAMUSCULAR | Status: AC
  Administered 2023-01-20: 60 mg via INTRAMUSCULAR

## 2023-01-20 MED ORDER — PREDNISONE 10 MG PO TABS: ORAL_TABLET | ORAL | 0 refills | Status: DC

## 2023-01-20 NOTE — ED Provider Notes (Signed)
[facility]    CSN: [id number] Arrival date & time: 01/20/23  1626      History   Chief Complaint Chief Complaint  Patient presents with   Hip Pain   Leg Pain    HPI Jon Gordon is a 77 y.o. male presenting for right leg pain which has worsened over the past couple of days. Reports pain in thigh and says it feels like a muscle type pain. He does have history of chronic back pain and sciatica and follows up with a spine specialist. He is scheduled to have an ablation on 02/27/2023. Not currently taking any regular medicine for his back pain. He has tried OTC meds before but says they do not help. He avoids NSAIDs due to being on Eliquis. He says corticosteroids have been helpful in the past. He does daily stretches and says that helps. Pain worse with change in position. Reports pain all the way down the right leg. No numbness or weakness. No loss of bowel or bladder control.  HPI  Past Medical History:  Diagnosis Date   Cancer (HCC)    Hypertension    Low back pain     Patient Active Problem List   Diagnosis Date Noted   BPH (benign prostatic hyperplasia) 01/14/2013     Past Surgical History:  Procedure Laterality Date   PORT A CATH INJECTION ([facility] HX)     Removal Spleen     Right Knee Arthorscopy     TONSILLECTOMY         Home Medications    Prior to Admission medications   Medication Sig Start Date End Date Taking? Authorizing Provider  cyclobenzaprine (FLEXERIL) 10 MG tablet Take 1 tablet (10 mg total) by mouth 3 (three) times daily as needed for muscle spasms. 01/20/23  Yes Shirlee Latch, PA-C  predniSONE (DELTASONE) 10 MG tablet Take 5 tabs on day 1 and decrease by 1 tablet daily until complete. 01/20/23  Yes Eusebio Friendly B, PA-C  baclofen (LIORESAL) 10 MG tablet Take 1 tablet (10 mg total) by mouth 3 (three) times daily. 05/05/22   Becky Augusta, NP  DULoxetine (CYMBALTA) 20 MG capsule Take 20 mg by mouth 2 (two) times daily. 12/08/22 12/08/23   [provider]  ELIQUIS 5 MG TABS tablet Take by mouth. 07/27/22   [provider]  lisinopril (ZESTRIL) 2.5 MG tablet Take 2.5 mg by mouth daily. 12/21/20   [provider]  omeprazole (PRILOSEC) 40 MG capsule Take 40 mg by mouth daily. 11/03/22 11/03/23  [provider]  polyethylene glycol powder (GLYCOLAX/MIRALAX) 17 GM/SCOOP powder Take 17 g by mouth daily.    [provider]    Family History No family history on file.  Social History Social History   Tobacco Use   Smoking status: Never   Smokeless tobacco: Never  Vaping Use   Vaping Use: Never used  Substance Use Topics   Alcohol use: No   Drug use: No     Allergies   Aspirin and Nexium [esomeprazole]   Review of Systems Review of Systems  Constitutional:  Negative for fatigue and fever.  Gastrointestinal:  Negative for abdominal pain, nausea and vomiting.  Genitourinary:  Negative for dysuria, flank pain and hematuria.  Musculoskeletal:  Positive for arthralgias and back pain (chronic).  Skin:  Negative for rash.  Neurological:  Negative for weakness and numbness.     Physical Exam Triage Vital Signs ED Triage Vitals  Enc Vitals Group  BP 02/22/21 1502 137/60     Pulse Rate 02/22/21 1502 62     Resp 02/22/21 1502 16     Temp 02/22/21 1502 98.7 F (37.1 C)     Temp Source 02/22/21 1502 Oral     SpO2 02/22/21 1502 97 %     Weight 02/22/21 1500 218 lb 0.6 oz (98.9 kg)     Height 02/22/21 1500 5\' 10"  (1.778 m)     Head Circumference --      Peak Flow --      Pain Score 02/22/21 1500 4     Pain Loc --      Pain Edu? --      Excl. in [place]? --    No data found.  Updated Vital Signs BP 131/69 (BP Location: Left Arm)   Pulse 62   Temp 98.9 F (37.2 C) (Oral)   SpO2 95%       Physical Exam Vitals and nursing note reviewed.  Constitutional:      General: He is not in acute distress.    Appearance: Normal appearance. He is well-developed. He is not  ill-appearing.  HENT:     Head: Normocephalic and atraumatic.  Eyes:     General: No scleral icterus.    Conjunctiva/sclera: Conjunctivae normal.  Cardiovascular:     Rate and Rhythm: Normal rate and regular rhythm.     Heart sounds: Normal heart sounds.  Pulmonary:     Effort: Pulmonary effort is normal. No respiratory distress.     Breath sounds: Normal breath sounds.  Musculoskeletal:     Cervical back: Neck supple.     Lumbar back: Tenderness (TTP bilateral paralumbar muscles with associated stiffness) present. No bony tenderness. Normal range of motion. Positive right straight leg raise test.  Skin:    General: Skin is warm and dry.  Neurological:     General: No focal deficit present.     Mental Status: He is alert. Mental status is at baseline.     Motor: No weakness.     Gait: Gait normal.  Psychiatric:        Mood and Affect: Mood normal.        Behavior: Behavior normal.        Thought Content: Thought content normal.      UC Treatments / Results  Labs (all labs ordered are listed, but only abnormal results are displayed) Labs Reviewed - No data to display  EKG   Radiology No results found.  Procedures Procedures (including critical care time)  Medications Ordered in UC Medications  methylPREDNISolone acetate (DEPO-MEDROL) injection 60 mg (60 mg Intramuscular Given 01/20/23 1710)    Initial Impression / Assessment and Plan / UC Course  I have reviewed the triage vital signs and the nursing notes.  Pertinent labs & imaging results that were available during my care of the patient were reviewed by me and considered in my medical decision making (see chart for details).  77 year old male presenting for right leg pain and chronic low back pain. Sees a spine specialist. Scheduled for ablation in 1 month.  No red flag signs or symptoms.  He does request a corticosteroid injection and to try muscle relaxer at this time.  Patient given 60 mg IM depo medrol.   Start prednisone taper tomorrow.  Sent in cyclobenzaprine. Advised him to be careful with medications that can cause drowsiness.  Also advised Tylenol for pain relief, heating pad, and use of lidocaine patches.  Advised patient  to follow up with specialist. ED precautions discussed.   Final Clinical Impressions(s) / UC Diagnoses   Final diagnoses:  Chronic bilateral low back pain with right-sided sciatica     Discharge Instructions      -We have given you an injection of a corticosteroid in the clinic. - Start the prednisone tablets tomorrow. - You can take Tylenol for pain. - I also sent a muscle relaxer. - Continue with your stretches and follow-up with a spine specialist. - Go to ER for any acute worsening of your pain or if you have loss of bowel or bladder control.        ED Prescriptions     Medication Sig Dispense Auth. Provider   predniSONE (DELTASONE) 20 MG tablet  (Status: Discontinued) Take 5 tablets p.o. on day 1 and decrease by 1 tablet daily until complete 15 tablet Eusebio Friendly B, PA-C   cyclobenzaprine (FLEXERIL) 10 MG tablet Take 1 tablet (10 mg total) by mouth 3 (three) times daily as needed for muscle spasms. 20 tablet Eusebio Friendly B, PA-C   predniSONE (DELTASONE) 10 MG tablet Take 5 tabs on day 1 and decrease by 1 tablet daily until complete. 15 tablet Gareth Morgan      PDMP not reviewed this encounter.     Shirlee Latch, PA-C 01/20/23 [id number]

## 2023-01-20 NOTE — Discharge Instructions (Addendum)
-  We have given you an injection of a corticosteroid in the clinic. - Start the prednisone tablets tomorrow. - You can take Tylenol for pain. - I also sent a muscle relaxer. - Continue with your stretches and follow-up with a spine specialist. - Go to ER for any acute worsening of your pain or if you have loss of bowel or bladder control.

## 2023-01-20 NOTE — ED Triage Notes (Signed)
Pt presents to UC c/o RT hip & RT leg pain, pt states he has had this before pinched nerve.

## 2023-03-10 ENCOUNTER — Ambulatory Visit
Admission: EM | Admit: 2023-03-10 | Discharge: 2023-03-10 | Disposition: A | Discharge status: Home / Self Care | Payer: Medicare Other

## 2023-03-10 DIAGNOSIS — M79604 Pain in right leg: Secondary | ICD-10-CM | POA: Diagnosis not present

## 2023-03-10 NOTE — Discharge Instructions (Addendum)
Take home meds as directed. Follow up with your neurosurgeon, we are unable to repeat steroid injection with recent epidural injection 18 days prior.   Go to ER for new or worsening issues or concerns.

## 2023-03-10 NOTE — ED Provider Notes (Signed)
[facility]    CSN: [id number] Arrival date & time: 03/10/23  1518      History   Chief Complaint Chief Complaint  Patient presents with   Leg Pain    HPI Jon Gordon is a 77 y.o. male.   77 year old male pt, Jon Gordon, presents to urgent care for evaluation of right thigh/leg pain.  Patient has a history of chronic back pain and right leg pain;recently had epidural injection under fluoroscopy 18 days prior.  Patient states he was feeling better and doing exercises because he was feeling well after steroid injection, unsure why he does not feel well now.  Patient requesting another steroid injection today in office.  Patient states "muscle relaxers and pain meds do not work", patient denies any loss of bowel and bladder.  The history is provided by the patient. No language interpreter was used.    Past Medical History:  Diagnosis Date   Cancer [facility])    Hypertension    Low back pain     Patient Active Problem List   Diagnosis Date Noted   Right leg pain 03/10/2023   BPH (benign prostatic hyperplasia) 01/14/2013    Past Surgical History:  Procedure Laterality Date   PORT A CATH INJECTION ([facility] HX)     Removal Spleen     Right Knee Arthorscopy     TONSILLECTOMY         Home Medications    Prior to Admission medications   Medication Sig Start Date End Date Taking? Authorizing Provider  baclofen (LIORESAL) 10 MG tablet Take 1 tablet (10 mg total) by mouth 3 (three) times daily. 05/05/22   Becky Augusta, NP  cyclobenzaprine (FLEXERIL) 10 MG tablet Take 1 tablet (10 mg total) by mouth 3 (three) times daily as needed for muscle spasms. 01/20/23   Shirlee Latch, PA-C  DULoxetine (CYMBALTA) 20 MG capsule Take 20 mg by mouth 2 (two) times daily. 12/08/22 12/08/23  [provider]  ELIQUIS 5 MG TABS tablet Take by mouth. 07/27/22   [provider]  lisinopril (ZESTRIL) 2.5 MG tablet Take 2.5 mg by mouth daily. 12/21/20   [provider]  omeprazole (PRILOSEC) 40 MG capsule Take 40 mg by mouth daily. 11/03/22 11/03/23  [provider]  polyethylene glycol powder (GLYCOLAX/MIRALAX) 17 GM/SCOOP powder Take 17 g by mouth daily.    [provider]  predniSONE (DELTASONE) 10 MG tablet Take 5 tabs on day 1 and decrease by 1 tablet daily until complete. 01/20/23   Shirlee Latch, PA-C    Family History History reviewed. No pertinent family history.  Social History Social History   Tobacco Use   Smoking status: Never   Smokeless tobacco: Never  Vaping Use   Vaping status: Never Used  Substance Use Topics   Alcohol use: No   Drug use: No     Allergies   Aspirin and Nexium [esomeprazole]   Review of Systems Review of Systems  Musculoskeletal:  Positive for myalgias.  All other systems reviewed and are negative.    Physical Exam Triage Vital Signs ED Triage Vitals [03/10/23 1558]  Encounter Vitals Group     BP 122/62     Systolic BP Percentile      Diastolic BP Percentile      Pulse Rate 68     Resp 17     Temp 98.6 F (37 C)     Temp Source Oral     SpO2  96 %     Weight 242 lb (109.8 kg)     Height      Head Circumference      Peak Flow      Pain Score 2     Pain Loc      Pain Education      Exclude from Growth Chart    No data found.  Updated Vital Signs BP 122/62 (BP Location: Right Arm)   Pulse 68   Temp 98.6 F (37 C) (Oral)   Resp 17   Wt 242 lb (109.8 kg)   SpO2 96%   BMI 34.72 kg/m   Visual Acuity Right Eye Distance:   Left Eye Distance:   Bilateral Distance:    Right Eye Near:   Left Eye Near:    Bilateral Near:     Physical Exam Vitals and nursing note reviewed.  Cardiovascular:     Rate and Rhythm: Normal rate.     Pulses: Normal pulses.          Dorsalis pedis pulses are 2+ on the right side and 2+ on the left side.  Musculoskeletal:     Lumbar back: Spasms and tenderness present. Positive right straight leg raise test.        Legs:  Neurological:     General: No focal deficit present.     Mental Status: He is alert and oriented to person, place, and time.     GCS: GCS eye subscore is 4. GCS verbal subscore is 5. GCS motor subscore is 6.     Cranial Nerves: No cranial nerve deficit.     Sensory: No sensory deficit.  Psychiatric:        Attention and Perception: Attention normal.        Mood and Affect: Mood normal.        Speech: Speech normal.        Behavior: Behavior normal.      UC Treatments / Results  Labs (all labs ordered are listed, but only abnormal results are displayed) Labs Reviewed - No data to display  EKG   Radiology No results found.  Procedures Procedures (including critical care time)  Medications Ordered in UC Medications - No data to display  Initial Impression / Assessment and Plan / UC Course  I have reviewed the triage vital signs and the nursing notes.  Pertinent labs & imaging results that were available during my care of the patient were reviewed by me and considered in my medical decision making (see chart for details).     Ddx: Chronic back pain, sciatica, muscle spam Final Clinical Impressions(s) / UC Diagnoses   Final diagnoses:  Right leg pain     Discharge Instructions      Take home meds as directed. Follow up with your neurosurgeon, we are unable to repeat steroid injection with recent epidural injection 18 days prior.   Go to ER for new or worsening issues or concerns.     ED Prescriptions   None    PDMP not reviewed this encounter.   Clancy Gourd, NP 03/10/23 2105

## 2023-03-10 NOTE — ED Triage Notes (Signed)
Right leg pain. Patient done exercise after getting the epidural because he felt better and feels like he may have a pulled muscle in the inner part of his right leg.    Patient had epidural 4-5 weeks ago.

## 2023-11-26 ENCOUNTER — Ambulatory Visit (INDEPENDENT_AMBULATORY_CARE_PROVIDER_SITE_OTHER): Admitting: Urology

## 2023-11-26 ENCOUNTER — Encounter: Payer: Self-pay | Admitting: Urology

## 2023-11-26 VITALS — BP 118/69 | HR 75 | Ht 70.0 in | Wt 207.0 lb

## 2023-11-26 DIAGNOSIS — B356 Tinea cruris: Secondary | ICD-10-CM

## 2023-11-26 DIAGNOSIS — N529 Male erectile dysfunction, unspecified: Secondary | ICD-10-CM | POA: Diagnosis not present

## 2023-11-26 MED ORDER — NONFORMULARY OR COMPOUNDED ITEM
3 refills | Status: AC
Start: 2023-11-26 — End: ?

## 2023-11-26 MED ORDER — NYSTATIN 100000 UNIT/GM EX CREA: 1.0000 | TOPICAL_CREAM | Freq: Two times a day (BID) | CUTANEOUS | 0 refills | Status: AC

## 2023-11-26 NOTE — Progress Notes (Signed)
 11/26/2023 4:35 PM   Jon Gordon 1946/02/11 [id number]  Referring provider: Arno Lapidus, MD [address] [place],  [place] [address]  Urological history: 1.  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 BPH with LU TS  4. ED - Trimix #56, 0.3 cc prn   Chief Complaint  Patient presents with   Other   HPI: Jon Gordon is a 78 y.o. man who presents today for rash.   Previous records reviewed.   He has noticed a rash for the last 2 weeks on the ventral side of his penis and on his scrotum.  His wife has been applying a triple antibiotic ointment without resolution.  He states it is not painful or itching.    Patient denies any modifying or aggravating factors.  Patient denies any recent UTI's, gross hematuria, dysuria or suprapubic/flank pain.  Patient denies any fevers, chills, nausea or vomiting.    He is also in need of his Trimix prescription.  PMH: Past Medical History:  Diagnosis Date   Cancer (HCC)    Hypertension    Low back pain     Surgical History: Past Surgical History:  Procedure Laterality Date   PORT A CATH INJECTION ([facility] HX)     Removal Spleen     Right Knee Arthorscopy     TONSILLECTOMY      Home Medications:  Allergies as of 11/26/2023       Reactions   Aspirin Other (See Comments)   macroglobulinemia   Nexium [esomeprazole] Other (See Comments)   Bad Headaches        Medication List        Accurate as of Nov 26, 2023  4:35 PM. If you have any questions, ask your nurse or doctor.          STOP taking these medications    cyclobenzaprine  10 MG tablet Commonly known as: FLEXERIL    DULoxetine 20 MG capsule Commonly known as: CYMBALTA   predniSONE  10 MG tablet Commonly known as: DELTASONE        TAKE these medications    baclofen  10 MG tablet Commonly  known as: LIORESAL  Take 1 tablet (10 mg total) by mouth 3 (three) times daily.   Eliquis 5 MG Tabs tablet Generic drug: apixaban Take by mouth.   lisinopril 2.5 MG tablet Commonly known as: ZESTRIL Take 2.5 mg by mouth daily.   NONFORMULARY OR COMPOUNDED ITEM Trimix # 56  Inject 0.3 cc prior to intercourse.  Do not inject more than 3 times weekly.   nystatin cream Commonly known as: MYCOSTATIN Apply 1 Application topically 2 (two) times daily.   omeprazole 40 MG capsule Commonly known as: PRILOSEC Take 40 mg by mouth daily.   polyethylene glycol powder 17 GM/SCOOP powder Commonly known as: GLYCOLAX/MIRALAX Take 17 g by mouth daily.        Allergies:  Allergies  Allergen Reactions   Aspirin Other (See Comments)    macroglobulinemia   Nexium [Esomeprazole] Other (See Comments)    Bad Headaches    Family History: No family history on file.  Social History:  reports that he has never smoked. He has never used smokeless tobacco. He reports that he does not drink alcohol and does not use drugs.  ROS: Pertinent ROS in HPI  Physical Exam: BP 118/69   Pulse  75   Ht 5\' 10"  (1.778 m)   Wt 207 lb (93.9 kg)   BMI 29.70 kg/m   Constitutional:  Well nourished. Alert and oriented, No acute distress. HEENT: [place] AT, moist mucus membranes.  Trachea midline Cardiovascular: No clubbing, cyanosis, or edema. Respiratory: Normal respiratory effort, no increased work of breathing. GU: No CVA tenderness.  No bladder fullness or masses.  Patient with uncircumcised phallus.  Foreskin easily retracted.  Urethral meatus is patent.  No penile discharge.  There is a erythematous rash on the ventral side of his penile shaft and on the anterior portion of his scrotum.  Scrotum without lesions, cysts and/or edema.  Neurologic: Grossly intact, no focal deficits, moving all 4 extremities. Psychiatric: Normal mood and affect.  Laboratory Data: Contains abnormal data Comprehensive Metabolic  Panel (CMP) Order: [id number] Component Ref Range & Units 2 wk ago  Sodium 135 - 145 mmol/L 140  Potassium 3.5 - 5.0 mmol/L 3.4 Low   Chloride 98 - 108 mmol/L 105  Carbon Dioxide (CO2) 21 - 30 mmol/L 28  Urea Nitrogen (BUN) 7 - 20 mg/dL 12  Creatinine 0.6 - 1.3 mg/dL 0.7  Glucose 70 - 829 mg/dL 562 High   Comment: Interpretive Data: Above is the NONFASTING reference range.  Below are the FASTING reference ranges: NORMAL:      70-99 mg/dL PREDIABETES: 130-865 mg/dL DIABETES:    > 784 mg/dL  Calcium 8.7 - 69.6 mg/dL 9.5  AST (Aspartate Aminotransferase) 15 - 41 U/L 21  ALT (Alanine Aminotransferase) 15 - 50 U/L 23  Bilirubin, Total 0.4 - 1.5 mg/dL 0.5  Alk Phos (Alkaline Phosphatase) 24 - 110 U/L 62  Albumin 3.5 - 4.8 g/dL 3.3 Low   Protein, Total 6.2 - 8.1 g/dL 6.1 Low   Anion Gap 3 - 12 mmol/L 7  BUN/CREA Ratio 6 - 27 16  Glomerular Filtration Rate (eGFR) mL/min/1.73sq m 94  Comment: CKD-EPI (2021) does not include patient's race in the calculation of eGFR. Monitoring changes of plasma creatinine and eGFR over time is useful for monitoring kidney function.  This change was made on 09/18/2020.  Interpretive Ranges for eGFR(CKD-EPI 2021):  eGFR:              > 60 mL/min/1.73 sq m - Normal eGFR:              30 - 59 mL/min/1.73 sq m - Moderately Decreased eGFR:              15 - 29 mL/min/1.73 sq m - Severely Decreased eGFR:              < 15 mL/min/1.73 sq m -  Kidney Failure   Note: These eGFR calculations do not apply in acute situations when eGFR is changing rapidly or in patients on dialysis.  Resulting Agency [facility]   Specimen Collected: 11/06/23 08:12   Performed by: [facility] Last Resulted: 11/06/23 09:01  Received From: [facility]  Result Received: 11/24/23 08:15   ontains abnormal data Complete Blood Count (CBC) with Differential Order: [id number] Component Ref Range & Units 2 wk ago  WBC (White  Blood Cell Count) 3.2 - 9.8 x10^9/L 8.7  Hemoglobin 13.7 - 17.3 g/dL 44.0 Low   Hematocrit 10.2 - 49.0 % 39.5  Platelets 150 - 450 x10^9/L 371  MCV (Mean Corpuscular Volume) 80 - 98 fL 101 High   MCH (Mean Corpuscular Hemoglobin) 26.5 - 34.0 pg 32.8  MCHC (Mean Corpuscular Hemoglobin  Concentration) 31.5 - 36.3 % 32.4  RBC (Red Blood Cell Count) 4.37 - 5.74 x10^12/L 3.9 Low   RDW-CV (Red Cell Distribution Width) 11.5 - 14.5 % 16.9 High   NRBC (Nucleated Red Blood Cell Count) 0 x10^9/L 0  NRBC % (Nucleated Red Blood Cell %) % 0  MPV (Mean Platelet Volume) 7.2 - 11.7 fL 9.8  Neutrophil Count 2.0 - 8.6 x10^9/L 5.1  Neutrophil % 37 - 80 % 59.4  Lymphocyte Count 0.6 - 4.2 x10^9/L 2.1  Lymphocyte % 10 - 50 % 24  Monocyte Count 0 - 0.9 x10^9/L 0.9  Monocyte % 0 - 12 % 10.9  Eosinophil Count 0 - 0.70 x10^9/L 0.38  Eosinophil % 0 - 7 % 4.4  Basophil Count 0 - 0.20 x10^9/L 0.09  Basophil % 0 - 2 % 1  Immature Granulocyte Count <=0.06 x10^9/L 0.03  Immature Granulocyte % <=0.7 % 0.3  Resulting Agency [facility]   Specimen Collected: 11/06/23 08:12   Performed by: [facility] Last Resulted: 11/06/23 08:39  Received From: [facility]  Result Received: 11/24/23 08:15  I have reviewed the labs.   Pertinent Imaging: N/A  Assessment & Plan:    1.  Tinea cruris  - Prescribed nystatin cream to be applied twice daily after the penis and scrotum are washed with soapy water and dry completely -He will return in 2 weeks to ensure the rash area clears with the nystatin cream  2. ED - refill for Trimix sent to [facility]    Return in about 2 weeks (around 12/10/2023) for recheck .  These notes generated with voice recognition software. I apologize for typographical errors.  Briant Camper  [facility] [address]  [address] [place], [place] [address] [phone]

## 2023-12-04 ENCOUNTER — Ambulatory Visit
Admission: EM | Admit: 2023-12-04 | Discharge: 2023-12-04 | Disposition: A | Discharge status: Home / Self Care | Attending: Emergency Medicine | Admitting: Emergency Medicine

## 2023-12-04 ENCOUNTER — Encounter: Payer: Self-pay | Admitting: Emergency Medicine

## 2023-12-04 DIAGNOSIS — G8929 Other chronic pain: Secondary | ICD-10-CM | POA: Diagnosis not present

## 2023-12-04 DIAGNOSIS — M545 Low back pain, unspecified: Secondary | ICD-10-CM | POA: Diagnosis not present

## 2023-12-04 MED ORDER — DEXAMETHASONE SODIUM PHOSPHATE 10 MG/ML IJ SOLN
10.0000 mg | Freq: Once | INTRAMUSCULAR | Status: AC
  Administered 2023-12-04: 10 mg via INTRAMUSCULAR

## 2023-12-04 MED ORDER — PREDNISONE 10 MG (21) PO TBPK: ORAL_TABLET | ORAL | 0 refills | Status: DC

## 2023-12-04 NOTE — ED Triage Notes (Signed)
 Pt c/o lower back pain. Started about 2 months ago. He states he has chronic back pain and see neurosurgery and could not get into them until June.

## 2023-12-04 NOTE — Discharge Instructions (Addendum)
 Start the prednisone  taper tomorrow morning and take it each morning at breakfast for the subsequent 6 days.  We have given you an IM injection of Decadron , which is a steroid, in urgent care today and this will carry you through until tomorrow morning.  Restart your Eliquis as we discussed given that your lumbar spine injections are rescheduled to the end of June.  Follow-up with neurosurgery for any increased or continued pain.

## 2023-12-04 NOTE — ED Provider Notes (Signed)
 [facility]    CSN: [id number] Arrival date & time: 12/04/23  0853      History   Chief Complaint Chief Complaint  Patient presents with   Back Pain    HPI Jon Gordon is a 78 y.o. male.   HPI  78 year old male with past medical his significant for hypertension, chronic low back pain, BPH, and large cell lymphoma presents for evaluation of acute flare of his chronic low back pain.  He saw neurosurgery on 11/23/2023 and was supposed to receive lumbar medial branch blocks at L 3, L4, and L5 today after completing a 72-hour hold on his Eliquis.  However, his neurosurgeon had to reschedule the branch blocks until later in June and referred the patient to urgent care for management.  Past Medical History:  Diagnosis Date   Cancer [facility])    Hypertension    Low back pain     Patient Active Problem List   Diagnosis Date Noted   Right leg pain 03/10/2023   BPH (benign prostatic hyperplasia) 01/14/2013    Past Surgical History:  Procedure Laterality Date   PORT A CATH INJECTION ([facility] HX)     Removal Spleen     Right Knee Arthorscopy     TONSILLECTOMY         Home Medications    Prior to Admission medications   Medication Sig Start Date End Date Taking? Authorizing Provider  baclofen  (LIORESAL ) 10 MG tablet Take 1 tablet (10 mg total) by mouth 3 (three) times daily. 05/05/22  Yes Kent Pear, NP  ELIQUIS 5 MG TABS tablet Take by mouth. 07/27/22  Yes [provider]  lisinopril (ZESTRIL) 2.5 MG tablet Take 2.5 mg by mouth daily. 12/21/20  Yes [provider]  omeprazole (PRILOSEC) 40 MG capsule Take 40 mg by mouth daily. 11/03/22 12/04/23 Yes [provider]  predniSONE  (STERAPRED UNI-PAK 21 TAB) 10 MG (21) TBPK tablet Take 6 tablets on day 1, 5 tablets day 2, 4 tablets day 3, 3 tablets day 4, 2 tablets day 5, 1 tablet day 6 12/04/23  Yes Kent Pear, NP  NONFORMULARY OR COMPOUNDED ITEM Trimix # 56  Inject 0.3 cc prior to intercourse.   Do not inject more than 3 times weekly. 11/26/23   Matilde Son A, PA-C  nystatin  cream (MYCOSTATIN ) Apply 1 Application topically 2 (two) times daily. 11/26/23   Matilde Son A, PA-C  polyethylene glycol powder (GLYCOLAX/MIRALAX) 17 GM/SCOOP powder Take 17 g by mouth daily.    [provider]    Family History History reviewed. No pertinent family history.  Social History Social History   Tobacco Use   Smoking status: Never   Smokeless tobacco: Never  Vaping Use   Vaping status: Never Used  Substance Use Topics   Alcohol use: No   Drug use: No     Allergies   Aspirin and Nexium [esomeprazole]   Review of Systems Review of Systems  Musculoskeletal:  Positive for back pain.     Physical Exam Triage Vital Signs ED Triage Vitals  Encounter Vitals Group     BP      Systolic BP Percentile      Diastolic BP Percentile      Pulse      Resp      Temp      Temp src      SpO2      Weight      Height      Head Circumference  Peak Flow      Pain Score      Pain Loc      Pain Education      Exclude from Growth Chart    No data found.  Updated Vital Signs BP 110/61 (BP Location: Left Arm)   Pulse 72   Temp 98.7 F (37.1 C) (Oral)   Resp 16   Ht 5\' 10"  (1.778 m)   Wt 218 lb 0.6 oz (98.9 kg)   SpO2 92%   BMI 31.28 kg/m   Visual Acuity Right Eye Distance:   Left Eye Distance:   Bilateral Distance:    Right Eye Near:   Left Eye Near:    Bilateral Near:     Physical Exam Vitals and nursing note reviewed.  Constitutional:      Appearance: Normal appearance. He is not ill-appearing.  HENT:     Head: Normocephalic and atraumatic.  Skin:    General: Skin is warm and dry.     Capillary Refill: Capillary refill takes less than 2 seconds.     Findings: No rash.  Neurological:     General: No focal deficit present.     Mental Status: He is alert and oriented to person, place, and time.      UC Treatments / Results  Labs (all labs  ordered are listed, but only abnormal results are displayed) Labs Reviewed - No data to display  EKG   Radiology No results found.  Procedures Procedures (including critical care time)  Medications Ordered in UC Medications - No data to display  Initial Impression / Assessment and Plan / UC Course  I have reviewed the triage vital signs and the nursing notes.  Pertinent labs & imaging results that were available during my care of the patient were reviewed by me and considered in my medical decision making (see chart for details).   Patient is a nontoxic-appearing 78 year old male presenting for evaluation of acute on chronic midline low back pain as outlined in HPI above.  He was supposed to receive lumbar spine blocks today but there is a scheduling conflict so he was referred here by neurosurgery.  He has completed a 5-day hold on his Eliquis and I have advised him to restart his Eliquis today given that he will not receive his lumbar spine injections until the end of June.  I will have staff administer 10 mg of IM Decadron  here in clinic and discharge him home on a 6-day prednisone  taper for management of his acute on chronic low back pain.   Final Clinical Impressions(s) / UC Diagnoses   Final diagnoses:  Acute on chronic low back pain     Discharge Instructions      Start the prednisone  taper tomorrow morning and take it each morning at breakfast for the subsequent 6 days.  We have given you an IM injection of Decadron , which is a steroid, in urgent care today and this will carry you through until tomorrow morning.  Restart your Eliquis as we discussed given that your lumbar spine injections are rescheduled to the end of June.  Follow-up with neurosurgery for any increased or continued pain.   ED Prescriptions     Medication Sig Dispense Auth. Provider   predniSONE  (STERAPRED UNI-PAK 21 TAB) 10 MG (21) TBPK tablet Take 6 tablets on day 1, 5 tablets day 2, 4 tablets  day 3, 3 tablets day 4, 2 tablets day 5, 1 tablet day 6 21 tablet Kent Pear, NP  PDMP not reviewed this encounter.   Kent Pear, NP 12/04/23 [phone]

## 2023-12-18 ENCOUNTER — Ambulatory Visit: Payer: Self-pay | Admitting: Urology

## 2023-12-21 ENCOUNTER — Ambulatory Visit: Admitting: Urology

## 2023-12-21 ENCOUNTER — Encounter: Payer: Self-pay | Admitting: Urology

## 2023-12-21 VITALS — BP 145/63 | HR 62 | Ht 70.0 in | Wt 212.4 lb

## 2023-12-21 DIAGNOSIS — N138 Other obstructive and reflux uropathy: Secondary | ICD-10-CM | POA: Diagnosis not present

## 2023-12-21 DIAGNOSIS — N529 Male erectile dysfunction, unspecified: Secondary | ICD-10-CM

## 2023-12-21 DIAGNOSIS — N401 Enlarged prostate with lower urinary tract symptoms: Secondary | ICD-10-CM | POA: Diagnosis not present

## 2023-12-21 DIAGNOSIS — B356 Tinea cruris: Secondary | ICD-10-CM | POA: Diagnosis not present

## 2023-12-21 MED ORDER — TAMSULOSIN HCL 0.4 MG PO CAPS: 0.4000 mg | ORAL_CAPSULE | Freq: Every day | ORAL | 3 refills | Status: DC

## 2023-12-21 NOTE — Progress Notes (Unsigned)
 12/21/2023 4:09 PM   Jon Gordon [birth year] [id number]  Referring provider: Arno Lapidus, MD [address] [place],  [place] [address]  Urological history: 1.  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 BPH with LU TS  4. ED - Trimix #56, 0.3 cc prn   Chief Complaint  Patient presents with   Follow-up   HPI: Jon Gordon is a 78 y.o. man who presents today two week follow up.   Previous records reviewed.   At his visit on Nov 26, 2023, he has noticed a rash for the last 2 weeks on the ventral side of his penis and on his scrotum.  His wife has been applying a triple antibiotic ointment without resolution.  He states it is not painful or itching.  Patient denies any modifying or aggravating factors.  Patient denies any recent UTI's, gross hematuria, dysuria or suprapubic/flank pain.  Patient denies any fevers, chills, nausea or vomiting.  He is also in need of his Trimix prescription.   He is given a prescription for nystatin  cream.    PMH: Past Medical History:  Diagnosis Date   Cancer (HCC)    Hypertension    Low back pain     Surgical History: Past Surgical History:  Procedure Laterality Date   PORT A CATH INJECTION ([facility] HX)     Removal Spleen     Right Knee Arthorscopy     TONSILLECTOMY      Home Medications:  Allergies as of 12/21/2023       Reactions   Aspirin Other (See Comments)   macroglobulinemia   Esomeprazole Other (See Comments)   Bad Headaches        Medication List        Accurate as of December 21, 2023  4:09 PM. If you have any questions, ask your nurse or doctor.          STOP taking these medications    baclofen  10 MG tablet Commonly known as: LIORESAL  Stopped by: Keesha Pellum   lisinopril 2.5 MG tablet Commonly known as: ZESTRIL Stopped by: Jansel Vonstein    omeprazole 40 MG capsule Commonly known as: PRILOSEC Stopped by: Matilde Son   predniSONE  10 MG (21) Tbpk tablet Commonly known as: STERAPRED UNI-PAK 21 TAB Stopped by: Kriss Perleberg       TAKE these medications    Eliquis 5 MG Tabs tablet Generic drug: apixaban Take by mouth.   midodrine 2.5 MG tablet Commonly known as: PROAMATINE Take 2.5 mg by mouth 3 (three) times daily with meals.   NONFORMULARY OR COMPOUNDED ITEM Trimix # 56  Inject 0.3 cc prior to intercourse.  Do not inject more than 3 times weekly.   nystatin  cream Commonly known as: MYCOSTATIN  Apply 1 Application topically 2 (two) times daily.   polyethylene glycol powder 17 GM/SCOOP powder Commonly known as: GLYCOLAX/MIRALAX Take 17 g by mouth daily.        Allergies:  Allergies  Allergen Reactions   Aspirin Other (See Comments)    macroglobulinemia   Esomeprazole Other (See Comments)    Bad Headaches    Family History: History reviewed. No pertinent family history.  Social History:  reports that he has never smoked. He has never used smokeless tobacco. He reports that he does not drink alcohol and does not use drugs.  ROS: Pertinent ROS in HPI  Physical Exam: BP (!) 145/63 (BP Location: Left Arm, Patient Position: Sitting, Cuff Size: Large)   Pulse 62   Ht 5\' 10"  (1.778 m)   Wt 212 lb 6.4 oz (96.3 kg)   SpO2 95%   BMI 30.48 kg/m   Constitutional:  Well nourished. Alert and oriented, No acute distress. HEENT: [place] AT, moist mucus membranes.  Trachea midline Cardiovascular: No clubbing, cyanosis, or edema. Respiratory: Normal respiratory effort, no increased work of breathing. GU: No CVA tenderness.  No bladder fullness or masses.  Patient with uncircumcised phallus.  Foreskin easily retracted.  Urethral meatus is patent.  No penile discharge.  There is a erythematous rash on the ventral side of his penile shaft and on the anterior portion of his scrotum.  Scrotum without lesions,  cysts and/or edema.  Neurologic: Grossly intact, no focal deficits, moving all 4 extremities. Psychiatric: Normal mood and affect.  Laboratory Data: Contains abnormal data Comprehensive Metabolic Panel (CMP) Order: [id number] Component Ref Range & Units 2 wk ago  Sodium 135 - 145 mmol/L 140  Potassium 3.5 - 5.0 mmol/L 3.4 Low   Chloride 98 - 108 mmol/L 105  Carbon Dioxide (CO2) 21 - 30 mmol/L 28  Urea Nitrogen (BUN) 7 - 20 mg/dL 12  Creatinine 0.6 - 1.3 mg/dL 0.7  Glucose 70 - 400 mg/dL 867 High   Comment: Interpretive Data: Above is the NONFASTING reference range.  Below are the FASTING reference ranges: NORMAL:      70-99 mg/dL PREDIABETES: 619-509 mg/dL DIABETES:    > 326 mg/dL  Calcium 8.7 - 71.2 mg/dL 9.5  AST (Aspartate Aminotransferase) 15 - 41 U/L 21  ALT (Alanine Aminotransferase) 15 - 50 U/L 23  Bilirubin, Total 0.4 - 1.5 mg/dL 0.5  Alk Phos (Alkaline Phosphatase) 24 - 110 U/L 62  Albumin 3.5 - 4.8 g/dL 3.3 Low   Protein, Total 6.2 - 8.1 g/dL 6.1 Low   Anion Gap 3 - 12 mmol/L 7  BUN/CREA Ratio 6 - 27 16  Glomerular Filtration Rate (eGFR) mL/min/1.73sq m 94  Comment: CKD-EPI (2021) does not include patient's race in the calculation of eGFR. Monitoring changes of plasma creatinine and eGFR over time is useful for monitoring kidney function.  This change was made on 09/18/2020.  Interpretive Ranges for eGFR(CKD-EPI 2021):  eGFR:              > 60 mL/min/1.73 sq m - Normal eGFR:              30 - 59 mL/min/1.73 sq m - Moderately Decreased eGFR:              15 - 29 mL/min/1.73 sq m - Severely Decreased eGFR:              < 15 mL/min/1.73 sq m -  Kidney Failure   Note: These eGFR calculations do not apply in acute situations when eGFR is changing rapidly or in patients on dialysis.  Resulting Agency [facility]   Specimen Collected: 11/06/23 08:12   Performed by: [facility] Last Resulted: 11/06/23 09:01  Received From: [facility]  Result Received: 11/24/23 08:15   ontains abnormal data Complete Blood Count (CBC) with Differential Order: [id number] Component Ref Range & Units 2 wk ago  WBC (White Blood Cell Count) 3.2 - 9.8 x10^9/L 8.7  Hemoglobin 13.7 - 17.3 g/dL 82.5 Low   Hematocrit 05.3 - 49.0 % 39.5  Platelets 150 -  450 x10^9/L 371  MCV (Mean Corpuscular Volume) 80 - 98 fL 101 High   MCH (Mean Corpuscular Hemoglobin) 26.5 - 34.0 pg 32.8  MCHC (Mean Corpuscular Hemoglobin Concentration) 31.5 - 36.3 % 32.4  RBC (Red Blood Cell Count) 4.37 - 5.74 x10^12/L 3.9 Low   RDW-CV (Red Cell Distribution Width) 11.5 - 14.5 % 16.9 High   NRBC (Nucleated Red Blood Cell Count) 0 x10^9/L 0  NRBC % (Nucleated Red Blood Cell %) % 0  MPV (Mean Platelet Volume) 7.2 - 11.7 fL 9.8  Neutrophil Count 2.0 - 8.6 x10^9/L 5.1  Neutrophil % 37 - 80 % 59.4  Lymphocyte Count 0.6 - 4.2 x10^9/L 2.1  Lymphocyte % 10 - 50 % 24  Monocyte Count 0 - 0.9 x10^9/L 0.9  Monocyte % 0 - 12 % 10.9  Eosinophil Count 0 - 0.70 x10^9/L 0.38  Eosinophil % 0 - 7 % 4.4  Basophil Count 0 - 0.20 x10^9/L 0.09  Basophil % 0 - 2 % 1  Immature Granulocyte Count <=0.06 x10^9/L 0.03  Immature Granulocyte % <=0.7 % 0.3  Resulting Agency [facility]   Specimen Collected: 11/06/23 08:12   Performed by: [facility] Last Resulted: 11/06/23 08:39  Received From: [facility]  Result Received: 11/24/23 08:15  I have reviewed the labs.   Pertinent Imaging: N/A  Assessment & Plan:    1.  Tinea cruris  - Prescribed nystatin  cream to be applied twice daily after the penis and scrotum are washed with soapy water and dry completely -He will return in 2 weeks to ensure the rash area clears with the nystatin  cream  2. ED - refill for Trimix sent to [facility]    No follow-ups on file.  These notes generated with voice recognition software. I apologize  for typographical errors.  Briant Camper  [facility] [address]  [address] [place], [place] [address] [phone]

## 2024-01-02 ENCOUNTER — Ambulatory Visit
Admission: RE | Admit: 2024-01-02 | Discharge: 2024-01-02 | Disposition: A | Discharge status: Home / Self Care | Source: Ambulatory Visit | Attending: Emergency Medicine | Admitting: Emergency Medicine

## 2024-01-02 VITALS — BP 131/76 | HR 68 | Temp 98.6°F | Resp 16 | Ht 70.0 in | Wt 212.4 lb

## 2024-01-02 DIAGNOSIS — M545 Low back pain, unspecified: Secondary | ICD-10-CM | POA: Diagnosis not present

## 2024-01-02 DIAGNOSIS — G8929 Other chronic pain: Secondary | ICD-10-CM | POA: Diagnosis not present

## 2024-01-02 MED ORDER — DEXAMETHASONE SODIUM PHOSPHATE 10 MG/ML IJ SOLN
10.0000 mg | Freq: Once | INTRAMUSCULAR | Status: AC
  Administered 2024-01-02: 10 mg via INTRAMUSCULAR

## 2024-01-02 MED ORDER — PREDNISONE 10 MG (21) PO TBPK: ORAL_TABLET | ORAL | 0 refills | Status: AC

## 2024-01-02 NOTE — Discharge Instructions (Addendum)
 Take the prednisone  according to the package instructions.  Start this tomorrow morning at breakfast time.  You will continue it for 6 days.  Keep your appointment to have your spinal injections with your neurosurgeon on the 24th as previously scheduled.

## 2024-01-02 NOTE — ED Provider Notes (Signed)
 [facility]    CSN: [id number] Arrival date & time: 01/02/24  1017      History   Chief Complaint Chief Complaint  Patient presents with   Back Pain    HPI Jon Gordon is a 78 y.o. male.   HPI  78 year old male with past medical history significant for BPH, chronic low back pain, hypertension, and large cell lymphoma presents for evaluation of acute on chronic low back pain exacerbation.  He is followed by neurosurgery and is set to have lumbar spine injections on 01/12/2024.  He was seen previously in this urgent care 1 month ago after his lumbar spine injections were canceled.  He presents today for evaluation of his low back pain and pain relief to bridge him until he can have his spinal injections.  Past Medical History:  Diagnosis Date   Cancer [facility])    Hypertension    Low back pain     Patient Active Problem List   Diagnosis Date Noted   Right leg pain 03/10/2023   BPH (benign prostatic hyperplasia) 01/14/2013    Past Surgical History:  Procedure Laterality Date   PORT A CATH INJECTION ([facility] HX)     Removal Spleen     Right Knee Arthorscopy     TONSILLECTOMY         Home Medications    Prior to Admission medications   Medication Sig Start Date End Date Taking? Authorizing Provider  ELIQUIS 5 MG TABS tablet Take by mouth. 07/27/22  Yes [provider]  midodrine (PROAMATINE) 2.5 MG tablet Take 2.5 mg by mouth 3 (three) times daily with meals. 12/09/23  Yes [provider]  NONFORMULARY OR COMPOUNDED ITEM Trimix # 56  Inject 0.3 cc prior to intercourse.  Do not inject more than 3 times weekly. 11/26/23  Yes McGowan, Cathleen Coach A, PA-C  nystatin  cream (MYCOSTATIN ) Apply 1 Application topically 2 (two) times daily. 11/26/23  Yes McGowan, Cathleen Coach A, PA-C  polyethylene glycol powder (GLYCOLAX/MIRALAX) 17 GM/SCOOP powder Take 17 g by mouth daily.   Yes [provider]  predniSONE  (STERAPRED UNI-PAK 21 TAB) 10 MG (21) TBPK  tablet Take 6 tablets on day 1, 5 tablets day 2, 4 tablets day 3, 3 tablets day 4, 2 tablets day 5, 1 tablet day 6 01/02/24  Yes Kent Pear, NP  tamsulosin  (FLOMAX ) 0.4 MG CAPS capsule Take 1 capsule (0.4 mg total) by mouth daily. 12/21/23  Yes McGowan, Danne Dustman, PA-C    Family History History reviewed. No pertinent family history.  Social History Social History   Tobacco Use   Smoking status: Never   Smokeless tobacco: Never  Vaping Use   Vaping status: Never Used  Substance Use Topics   Alcohol use: No   Drug use: No     Allergies   Aspirin and Esomeprazole   Review of Systems Review of Systems  Musculoskeletal:  Positive for back pain.     Physical Exam Triage Vital Signs ED Triage Vitals  Encounter Vitals Group     BP      Girls Systolic BP Percentile      Girls Diastolic BP Percentile      Boys Systolic BP Percentile      Boys Diastolic BP Percentile      Pulse      Resp      Temp      Temp src      SpO2      Weight  Height      Head Circumference      Peak Flow      Pain Score      Pain Loc      Pain Education      Exclude from Growth Chart    No data found.  Updated Vital Signs BP 131/76 (BP Location: Right Arm)   Pulse 68   Temp 98.6 F (37 C) (Oral)   Resp 16   Ht 5' 10 (1.778 m)   Wt 212 lb 6.4 oz (96.3 kg)   SpO2 94%   BMI 30.48 kg/m   Visual Acuity Right Eye Distance:   Left Eye Distance:   Bilateral Distance:    Right Eye Near:   Left Eye Near:    Bilateral Near:     Physical Exam Vitals and nursing note reviewed.  Constitutional:      Appearance: Normal appearance. He is not ill-appearing.  HENT:     Head: Normocephalic and atraumatic.   Skin:    General: Skin is warm and dry.     Capillary Refill: Capillary refill takes less than 2 seconds.     Findings: No lesion or rash.   Neurological:     General: No focal deficit present.     Mental Status: He is alert and oriented to person, place, and time.       UC Treatments / Results  Labs (all labs ordered are listed, but only abnormal results are displayed) Labs Reviewed - No data to display  EKG   Radiology No results found.  Procedures Procedures (including critical care time)  Medications Ordered in UC Medications  dexamethasone  (DECADRON ) injection 10 mg (has no administration in time range)    Initial Impression / Assessment and Plan / UC Course  I have reviewed the triage vital signs and the nursing notes.  Pertinent labs & imaging results that were available during my care of the patient were reviewed by me and considered in my medical decision making (see chart for details).   Patient is a pleasant, nontoxic-appearing 78 year old male presenting for evaluation and management of his acute on chronic low back pain as outlined in HPI above.  He was previously treated with IM Decadron  followed by 6-day prednisone  taper and he reports that that worked for him.  He is due to have lumbar spine injections in 10 days.  I will have staff administer 10 mg of IM Decadron  and again discharged home on a 6-day prednisone  taper to bridge him until he can see neurosurgery for his spine injections on the 24th.   Final Clinical Impressions(s) / UC Diagnoses   Final diagnoses:  Acute on chronic low back pain     Discharge Instructions      Take the prednisone  according to the package instructions.  Start this tomorrow morning at breakfast time.  You will continue it for 6 days.  Keep your appointment to have your spinal injections with your neurosurgeon on the 24th as previously scheduled.     ED Prescriptions     Medication Sig Dispense Auth. Provider   predniSONE  (STERAPRED UNI-PAK 21 TAB) 10 MG (21) TBPK tablet Take 6 tablets on day 1, 5 tablets day 2, 4 tablets day 3, 3 tablets day 4, 2 tablets day 5, 1 tablet day 6 21 tablet Kent Pear, NP      PDMP not reviewed this encounter.   Kent Pear, NP 01/02/24  1043

## 2024-01-02 NOTE — ED Triage Notes (Signed)
 Pt c/o lower back pain x2 wks. Hx of chronic back pain. Has tried tylenol  w/o relief. States sch to see neurosurgery in 1 wk for back injections.

## 2024-01-08 ENCOUNTER — Other Ambulatory Visit: Payer: Self-pay | Admitting: Urology

## 2024-01-23 NOTE — Progress Notes (Deleted)
 01/25/2024 12:49 PM   JOSHUAN BOLANDER 11-29-1945 [id number]  Referring provider: Jyl Railing, MD [address] [place],  [place] [address]  Urological history: 1.  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 BPH with LU TS  4. ED - Trimix #56, 0.3 cc prn   No chief complaint on file.  HPI: Jon Gordon is a 78 y.o. man who presents today one month follow up after a trial of tamsulosin  for BPH with LU TS.    Previous records reviewed.   IPSS score: *** PVR: ***     Major complaint(s):  x *** years. Denies any dysuria, hematuria or suprapubic pain.   Currently taking: tamsulosin  0.4 mg daily.  He has had ***.   Denies any recent fevers, chills, nausea or vomiting.       Score:  1-7 Mild 8-19 Moderate 20-35 Severe     PMH: Past Medical History:  Diagnosis Date   Cancer (HCC)    Hypertension    Low back pain     Surgical History: Past Surgical History:  Procedure Laterality Date   PORT A CATH INJECTION ([facility] HX)     Removal Spleen     Right Knee Arthorscopy     TONSILLECTOMY      Home Medications:  Allergies as of 01/25/2024       Reactions   Aspirin Other (See Comments)   macroglobulinemia   Esomeprazole Other (See Comments)   Bad Headaches        Medication List        Accurate as of January 23, 2024 12:49 PM. If you have any questions, ask your nurse or doctor.          Eliquis 5 MG Tabs tablet Generic drug: apixaban Take by mouth.   midodrine 2.5 MG tablet Commonly known as: PROAMATINE Take 2.5 mg by mouth 3 (three) times daily with meals.   NONFORMULARY OR COMPOUNDED ITEM Trimix # 56  Inject 0.3 cc prior to intercourse.  Do not inject more than 3 times weekly.   nystatin  cream Commonly known as: MYCOSTATIN  Apply 1 Application topically 2 (two) times daily.    polyethylene glycol powder 17 GM/SCOOP powder Commonly known as: GLYCOLAX/MIRALAX Take 17 g by mouth daily.   predniSONE  10 MG (21) Tbpk tablet Commonly known as: STERAPRED UNI-PAK 21 TAB Take 6 tablets on day 1, 5 tablets day 2, 4 tablets day 3, 3 tablets day 4, 2 tablets day 5, 1 tablet day 6   tamsulosin  0.4 MG Caps capsule Commonly known as: FLOMAX  Take 1 capsule (0.4 mg total) by mouth daily.        Allergies:  Allergies  Allergen Reactions   Aspirin Other (See Comments)    macroglobulinemia   Esomeprazole Other (See Comments)    Bad Headaches    Family History: No family history on file.  Social History:  reports that he has never smoked. He has never used smokeless tobacco. He reports that he does not drink alcohol and does not use drugs.  ROS: Pertinent ROS in HPI  Physical Exam: There were no vitals taken for this visit.  Constitutional:  Well nourished. Alert and oriented, No acute distress. HEENT: [place] AT, moist mucus membranes.  Trachea midline, no masses. Cardiovascular: No clubbing, cyanosis, or edema. Respiratory: Normal respiratory effort, no increased  work of breathing. GI: Abdomen is soft, non tender, non distended, no abdominal masses. Liver and spleen not palpable.  No hernias appreciated.  Stool sample for occult testing is not indicated.   GU: No CVA tenderness.  No bladder fullness or masses.  Patient with circumcised/uncircumcised phallus. ***Foreskin easily retracted***  Urethral meatus is patent.  No penile discharge. No penile lesions or rashes. Scrotum without lesions, cysts, rashes and/or edema.  Testicles are located scrotally bilaterally. No masses are appreciated in the testicles. Left and right epididymis are normal. Rectal: Patient with  normal sphincter tone. Anus and perineum without scarring or rashes. No rectal masses are appreciated. Prostate is approximately *** grams, *** nodules are appreciated. Seminal vesicles are normal. Skin: No  rashes, bruises or suspicious lesions. Lymph: No cervical or inguinal adenopathy. Neurologic: Grossly intact, no focal deficits, moving all 4 extremities. Psychiatric: Normal mood and affect.   Laboratory Data: See EPIC and HPI I have reviewed the labs.    Pertinent Imaging: ***  Assessment & Plan:    1.  Tinea cruris  - ***  2. ED -  continue Trimix   3. BPH with LU TS - Start tamsulosin  0.4 mg daily - Discussed side effects   No follow-ups on file.  These notes generated with voice recognition software. I apologize for typographical errors.  CLOTILDA HELON RIGGERS  [facility] [address]  [address] [place], [place] [address] [phone]

## 2024-01-25 ENCOUNTER — Ambulatory Visit: Admitting: Urology

## 2024-01-25 DIAGNOSIS — N138 Other obstructive and reflux uropathy: Secondary | ICD-10-CM

## 2024-01-25 DIAGNOSIS — B356 Tinea cruris: Secondary | ICD-10-CM

## 2024-01-25 DIAGNOSIS — N529 Male erectile dysfunction, unspecified: Secondary | ICD-10-CM

## 2024-02-11 NOTE — Progress Notes (Unsigned)
 02/22/2024 4:42 PM   Jon Gordon 1945-08-26 [id number]  Referring provider: Jyl Railing, MD [address] [place],  [place] [address]  Urological history: 1.  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 BPH with LU TS  4. ED - Trimix #56, 0.3 cc prn   Chief Complaint  Patient presents with   Benign Prostatic Hypertrophy   HPI: Jon Gordon is a 78 y.o. man who presents today one month follow up after a trial of tamsulosin  for BPH with LU TS.    Previous records reviewed.   IPSS score: 0/0   PVR: 93 mL   The tamsulosin  0.4 mg daily seems to address his split urinary stream.  Denies any dysuria, hematuria or suprapubic pain.   Currently taking: tamsulosin  0.4 mg daily.  Denies any recent fevers, chills, nausea or vomiting.  He states the rash his almost completely resolved.  He was reading about tadalafil  and how can address prostate issues along with the ED issues and wondering if he could have prescription for this medication.   IPSS     Row Name 02/22/24 1500         International Prostate Symptom Score   How often have you had the sensation of not emptying your bladder? Not at All     How often have you had to urinate less than every two hours? Not at All     How often have you found you stopped and started again several times when you urinated? Not at All     How often have you found it difficult to postpone urination? Not at All     How often have you had a weak urinary stream? Not at All     How often have you had to strain to start urination? Not at All     How many times did you typically get up at night to urinate? None     Total IPSS Score 0       Quality of Life due to urinary symptoms   If you were to spend the rest of your life with your urinary condition just the way it is now how  would you feel about that? Delighted        Score:  1-7 Mild 8-19 Moderate 20-35 Severe    PMH: Past Medical History:  Diagnosis Date   Cancer (HCC)    Hypertension    Low back pain     Surgical History: Past Surgical History:  Procedure Laterality Date   PORT A CATH INJECTION ([facility] HX)     Removal Spleen     Right Knee Arthorscopy     TONSILLECTOMY      Home Medications:  Allergies as of 02/22/2024       Reactions   Aspirin Other (See Comments)   macroglobulinemia   Esomeprazole Other (See Comments)   Bad Headaches        Medication List        Accurate as of February 22, 2024  4:42 PM. If you have any questions, ask your nurse or doctor.          Eliquis 5 MG Tabs tablet Generic drug: apixaban Take by mouth.   midodrine 2.5 MG tablet Commonly known as: PROAMATINE Take 2.5 mg by mouth 3 (three) times daily with meals.  NONFORMULARY OR COMPOUNDED ITEM Trimix # 56  Inject 0.3 cc prior to intercourse.  Do not inject more than 3 times weekly.   nystatin  cream Commonly known as: MYCOSTATIN  Apply 1 Application topically 2 (two) times daily.   polyethylene glycol powder 17 GM/SCOOP powder Commonly known as: GLYCOLAX/MIRALAX Take 17 g by mouth daily.   predniSONE  10 MG (21) Tbpk tablet Commonly known as: STERAPRED UNI-PAK 21 TAB Take 6 tablets on day 1, 5 tablets day 2, 4 tablets day 3, 3 tablets day 4, 2 tablets day 5, 1 tablet day 6   tadalafil  5 MG tablet Commonly known as: CIALIS  Take 1 tablet (5 mg total) by mouth daily. Started by: CLOTILDA CORNWALL   tamsulosin  0.4 MG Caps capsule Commonly known as: FLOMAX  Take 1 capsule (0.4 mg total) by mouth daily.        Allergies:  Allergies  Allergen Reactions   Aspirin Other (See Comments)    macroglobulinemia   Esomeprazole Other (See Comments)    Bad Headaches    Family History: No family history on file.  Social History:  reports that he has never smoked. He has never used  smokeless tobacco. He reports that he does not drink alcohol and does not use drugs.  ROS: Pertinent ROS in HPI  Physical Exam: BP 130/61 (BP Location: Left Arm, Patient Position: Sitting, Cuff Size: Normal)   Pulse 67   Ht 5' 10 (1.778 m)   Wt 212 lb (96.2 kg)   SpO2 96%   BMI 30.42 kg/m   Constitutional:  Well nourished. Alert and oriented, No acute distress. HEENT: [place] AT, moist mucus membranes.  Trachea midline Cardiovascular: No clubbing, cyanosis, or edema. Respiratory: Normal respiratory effort, no increased work of breathing. Neurologic: Grossly intact, no focal deficits, moving all 4 extremities. Psychiatric: Normal mood and affect.   Laboratory Data: See EPIC and HPI I have reviewed the labs.    Pertinent Imaging:  02/22/24 15:55  Scan Result 93mL    Assessment & Plan:    1.  Tinea cruris  - Resolving - Continue Mycolog cream as needed  2. ED -  continue Trimix  - Start tadalafil  5 mg daily - Sent prescription to [facility] so he could use Good[facility] coupon  3. BPH with LU TS - continue tamsulosin  0.4 mg daily  Return in about 1 year (around 02/21/2025) for UA, PVR and symptom recheck .  These notes generated with voice recognition software. I apologize for typographical errors.  CLOTILDA CORNWALL RIGGERS  [facility] [address]  [address] [place], [place] [address] [phone]

## 2024-02-22 ENCOUNTER — Encounter: Payer: Self-pay | Admitting: Urology

## 2024-02-22 ENCOUNTER — Ambulatory Visit (INDEPENDENT_AMBULATORY_CARE_PROVIDER_SITE_OTHER): Admitting: Urology

## 2024-02-22 VITALS — BP 130/61 | HR 67 | Ht 70.0 in | Wt 212.0 lb

## 2024-02-22 DIAGNOSIS — B356 Tinea cruris: Secondary | ICD-10-CM

## 2024-02-22 DIAGNOSIS — N401 Enlarged prostate with lower urinary tract symptoms: Secondary | ICD-10-CM

## 2024-02-22 DIAGNOSIS — N138 Other obstructive and reflux uropathy: Secondary | ICD-10-CM | POA: Diagnosis not present

## 2024-02-22 DIAGNOSIS — N529 Male erectile dysfunction, unspecified: Secondary | ICD-10-CM

## 2024-02-22 LAB — BLADDER SCAN AMB NON-IMAGING

## 2024-02-22 MED ORDER — TADALAFIL 5 MG PO TABS: 5.0000 mg | ORAL_TABLET | Freq: Every day | ORAL | 11 refills | Status: DC

## 2024-08-02 ENCOUNTER — Ambulatory Visit: Admitting: Physician Assistant

## 2024-08-02 VITALS — Ht 69.0 in | Wt 223.8 lb

## 2024-08-02 DIAGNOSIS — N529 Male erectile dysfunction, unspecified: Secondary | ICD-10-CM

## 2024-08-02 DIAGNOSIS — N401 Enlarged prostate with lower urinary tract symptoms: Secondary | ICD-10-CM

## 2024-08-02 DIAGNOSIS — N138 Other obstructive and reflux uropathy: Secondary | ICD-10-CM | POA: Diagnosis not present

## 2024-08-02 LAB — BLADDER SCAN AMB NON-IMAGING

## 2024-08-02 MED ORDER — AMBULATORY NON FORMULARY MEDICATION: 0 refills | Status: AC

## 2024-08-02 MED ORDER — TADALAFIL 10 MG PO TABS: 10.0000 mg | ORAL_TABLET | Freq: Every day | ORAL | 11 refills | Status: AC

## 2024-08-02 MED ORDER — AMBULATORY NON FORMULARY MEDICATION: 0 refills | Status: DC

## 2024-08-02 NOTE — Progress Notes (Unsigned)
 "  08/02/2024 9:28 AM   Jon Gordon 1945-10-06 [id number]  CC: Chief Complaint  Patient presents with   BPH with obstruction/lower urinary tract symptoms   HPI: Jon Gordon is a 79 y.o. 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   Today he reports he was unsure if he was able to combine Flomax  and tadalafil , so he temporarily stopped Flomax .  He developed weak stream with this.  He subsequently resumed the Flomax  and the weak stream resolved.  He he seeks clarity today about whether or not he can take these medications together.  He describes nocturia x 3-4 despite dual pharmacotherapy.  He does have a history of OSA on CPAP, last titration unknown.  He denies orthostasis on Flomax .  He is getting inconsistent results with Trimix 0.3 mL per dose and wonders about an adjustment.  PVR .  PMH: Past Medical History:  Diagnosis Date   Cancer (HCC)    Hypertension    Low back pain     Surgical History: Past Surgical History:  Procedure Laterality Date   PORT A CATH INJECTION ([facility] HX)     Removal Spleen     Right Knee Arthorscopy     TONSILLECTOMY      Home Medications:  Allergies as of 08/02/2024       Reactions   Aspirin Other (See Comments)   macroglobulinemia   Esomeprazole Other (See Comments)   Bad Headaches        Medication List        Accurate as of August 02, 2024  9:28 AM. If you have any questions, ask your nurse or doctor.          STOP taking these medications    doxycycline 100 MG capsule Commonly known as: VIBRAMYCIN Stopped by: Jon Gordon   midodrine 2.5 MG tablet Commonly known as: PROAMATINE Stopped by: Jon Gordon   nitrofurantoin (macrocrystal-monohydrate) 100 MG capsule Commonly known  as: MACROBID Stopped by: Jon Gordon   pregabalin 25 MG capsule Commonly known as: LYRICA Stopped by: Jon Gordon       TAKE these medications    Accu-Chek Guide Test test strip Generic drug: glucose blood 1 each daily.   Accu-Chek Softclix Lancet Dev Kit SMARTSIG:1 Each Daily   amoxicillin 500 MG tablet Commonly known as: AMOXIL Take by mouth.   apixaban 5 MG Tabs tablet Commonly known as: ELIQUIS Take 5 mg by mouth.   clotrimazole 1 % cream Commonly known as: LOTRIMIN Apply twice daily to the affected areas until clear (abdomen, lower leg).   DULoxetine 20 MG capsule Commonly known as: CYMBALTA Take 20 mg by mouth.   Fingerstix Lancets Misc 1 each by Other route.   GlucoCom Blood Glucose Monitor Devi 1 each.   NONFORMULARY OR COMPOUNDED ITEM Trimix # 56  Inject 0.3 cc prior to intercourse.  Do not inject more than 3 times weekly.   nystatin  cream Commonly known as: MYCOSTATIN  Apply 1 Application topically 2 (two) times daily.   omeprazole 40 MG capsule Commonly known as: PRILOSEC Take 40 mg by mouth.   polyethylene glycol powder 17 GM/SCOOP powder Commonly known as: GLYCOLAX/MIRALAX Take 17 g by mouth daily.   predniSONE  10 MG (21) Tbpk tablet Commonly known as: STERAPRED UNI-PAK 21 TAB Take 6  tablets on day 1, 5 tablets day 2, 4 tablets day 3, 3 tablets day 4, 2 tablets day 5, 1 tablet day 6   tadalafil  5 MG tablet Commonly known as: CIALIS  Take 1 tablet (5 mg total) by mouth daily.   tamsulosin  0.4 MG Caps capsule Commonly known as: FLOMAX  Take 1 capsule (0.4 mg total) by mouth daily.        Allergies:  Allergies[1]  Family History: No family history on file.  Social History:   reports that he has never smoked. He has never used smokeless tobacco. He reports that he does not drink alcohol and does not use drugs.  Physical Exam: Ht 5' 9 (1.753 m)   Wt 223 lb 12.8 oz (101.5 kg)   BMI 33.05 kg/m    Constitutional:  Alert and oriented, no acute distress, nontoxic appearing HEENT: [place], AT Cardiovascular: No clubbing, cyanosis, or edema Respiratory: Normal respiratory effort, no increased work of breathing Skin: No rashes, bruises or suspicious lesions Neurologic: Grossly intact, no focal deficits, moving all 4 extremities Psychiatric: Normal mood and affect  Laboratory Data: Results for orders placed or performed in visit on 08/02/24  Bladder Scan (Post Void Residual) in office   Collection Time: 08/02/24  9:25 AM  Result Value Ref Range   Scan Result    Assessment & Plan:   1. BPH with obstruction/lower urinary tract symptoms (Primary) Incomplete emptying and nocturia despite CPAP on dual pharmacotherapy.  Given concurrent ED, will augment daily Cialis  to 10 mg.  Continue Flomax .  I reassured him that it is okay to combine these medications. - Bladder Scan (Post Void Residual) in office - tadalafil  (CIALIS ) 10 MG tablet; Take 1 tablet (10 mg total) by mouth daily.  Dispense: 30 tablet; Refill: 11  2. Erectile dysfunction of organic origin Inconsistent results with 0.3 mL Trimix per dose.  Will set him up for Trimix retitration with [facility].  He signed the Trimix consent today before leaving. - AMBULATORY NON FORMULARY MEDICATION; Trimix (30/1/10)-(Pap/Phent/PGE)  Test Dose  1ml vial   Qty #3 Refills 0  [facility] [phone] Fax [phone]  Dispense: 3 vial; Refill: 0   Return in about 4 weeks (around 08/30/2024) for Trimix re-titration with Jon Gordon.  Jon Hones, PA-C  [facility] [place] [address] [place], [place] [address] [phone]      [1]  Allergies Allergen Reactions   Aspirin Other (See Comments)    macroglobulinemia   Esomeprazole Other (See Comments)    Bad Headaches   "

## 2024-08-02 NOTE — Patient Instructions (Signed)
 Increase Cialis  (tadalafil ) to 10mg  daily. Continue Flomax  (tamsulosin ) 0.4mg  daily.  Jon Gordon will see you back in clinic to readjust your Trimix dose in about a month. Please bring your Trimix with you on the day of your appointment and plan to be in clinic for about 2 hours.

## 2024-08-26 ENCOUNTER — Other Ambulatory Visit: Payer: Self-pay

## 2024-08-26 DIAGNOSIS — N401 Enlarged prostate with lower urinary tract symptoms: Secondary | ICD-10-CM

## 2024-08-26 MED ORDER — TAMSULOSIN HCL 0.4 MG PO CAPS: 0.4000 mg | ORAL_CAPSULE | Freq: Every day | ORAL | 3 refills | Status: AC

## 2024-08-30 ENCOUNTER — Ambulatory Visit: Admitting: Urology

## 2024-09-01 ENCOUNTER — Ambulatory Visit: Admitting: Urology

## 2025-02-27 ENCOUNTER — Ambulatory Visit: Admitting: Urology
